# Patient Record
Sex: Male | Born: 1965 | Race: White | Hispanic: No | Marital: Married | Smoking: Current every day smoker
Health system: Southern US, Community
[De-identification: ages and names within clinical notes are randomized; demographics above are authoritative.]

## PROBLEM LIST (undated history)

## (undated) DIAGNOSIS — Z87898 Personal history of other specified conditions: Secondary | ICD-10-CM

## (undated) DIAGNOSIS — K219 Gastro-esophageal reflux disease without esophagitis: Secondary | ICD-10-CM

## (undated) DIAGNOSIS — I499 Cardiac arrhythmia, unspecified: Secondary | ICD-10-CM

## (undated) DIAGNOSIS — J189 Pneumonia, unspecified organism: Secondary | ICD-10-CM

## (undated) DIAGNOSIS — K648 Other hemorrhoids: Secondary | ICD-10-CM

## (undated) DIAGNOSIS — D126 Benign neoplasm of colon, unspecified: Secondary | ICD-10-CM

## (undated) DIAGNOSIS — T7840XA Allergy, unspecified, initial encounter: Secondary | ICD-10-CM

## (undated) DIAGNOSIS — R519 Headache, unspecified: Secondary | ICD-10-CM

## (undated) DIAGNOSIS — K589 Irritable bowel syndrome without diarrhea: Secondary | ICD-10-CM

## (undated) DIAGNOSIS — R0602 Shortness of breath: Secondary | ICD-10-CM

## (undated) DIAGNOSIS — I493 Ventricular premature depolarization: Secondary | ICD-10-CM

## (undated) DIAGNOSIS — I10 Essential (primary) hypertension: Secondary | ICD-10-CM

## (undated) DIAGNOSIS — N401 Enlarged prostate with lower urinary tract symptoms: Secondary | ICD-10-CM

## (undated) DIAGNOSIS — R51 Headache: Secondary | ICD-10-CM

## (undated) DIAGNOSIS — K5730 Diverticulosis of large intestine without perforation or abscess without bleeding: Secondary | ICD-10-CM

## (undated) DIAGNOSIS — J45909 Unspecified asthma, uncomplicated: Secondary | ICD-10-CM

## (undated) DIAGNOSIS — Z8719 Personal history of other diseases of the digestive system: Secondary | ICD-10-CM

## (undated) DIAGNOSIS — I472 Ventricular tachycardia: Secondary | ICD-10-CM

## (undated) HISTORY — DX: Gastro-esophageal reflux disease without esophagitis: K21.9

## (undated) HISTORY — DX: Personal history of other specified conditions: Z87.898

## (undated) HISTORY — DX: Ventricular premature depolarization: I49.3

## (undated) HISTORY — DX: Benign neoplasm of colon, unspecified: D12.6

## (undated) HISTORY — PX: COLONOSCOPY: SHX174

## (undated) HISTORY — PX: POLYPECTOMY: SHX149

## (undated) HISTORY — DX: Other hemorrhoids: K64.8

## (undated) HISTORY — PX: DIAGNOSTIC LAPAROSCOPY: SUR761

## (undated) HISTORY — DX: Ventricular tachycardia: I47.2

## (undated) HISTORY — PX: VASECTOMY: SHX75

## (undated) HISTORY — PX: COLON SURGERY: SHX602

## (undated) HISTORY — DX: Allergy, unspecified, initial encounter: T78.40XA

## (undated) HISTORY — DX: Irritable bowel syndrome, unspecified: K58.9

---

## 2009-04-02 ENCOUNTER — Encounter: Payer: Self-pay | Admitting: Family Medicine

## 2009-12-23 ENCOUNTER — Ambulatory Visit: Payer: Self-pay | Admitting: Family Medicine

## 2009-12-23 DIAGNOSIS — K219 Gastro-esophageal reflux disease without esophagitis: Secondary | ICD-10-CM

## 2009-12-23 DIAGNOSIS — R197 Diarrhea, unspecified: Secondary | ICD-10-CM | POA: Insufficient documentation

## 2009-12-23 HISTORY — DX: Gastro-esophageal reflux disease without esophagitis: K21.9

## 2009-12-25 ENCOUNTER — Encounter: Payer: Self-pay | Admitting: Family Medicine

## 2009-12-26 ENCOUNTER — Telehealth: Payer: Self-pay | Admitting: Family Medicine

## 2009-12-26 LAB — CONVERTED CEMR LAB
BUN: 18 mg/dL (ref 6–23)
Basophils Relative: 0.9 % (ref 0.0–3.0)
CO2: 30 meq/L (ref 19–32)
Chloride: 100 meq/L (ref 96–112)
Eosinophils Absolute: 0.2 10*3/uL (ref 0.0–0.7)
Eosinophils Relative: 1.7 % (ref 0.0–5.0)
HCT: 48.2 % (ref 39.0–52.0)
Lymphs Abs: 2.3 10*3/uL (ref 0.7–4.0)
MCHC: 34.4 g/dL (ref 30.0–36.0)
MCV: 93.2 fL (ref 78.0–100.0)
Monocytes Absolute: 1.2 10*3/uL — ABNORMAL HIGH (ref 0.1–1.0)
Potassium: 4.4 meq/L (ref 3.5–5.1)
RBC: 5.17 M/uL (ref 4.22–5.81)
WBC: 10.6 10*3/uL — ABNORMAL HIGH (ref 4.5–10.5)

## 2009-12-29 ENCOUNTER — Telehealth: Payer: Self-pay | Admitting: Family Medicine

## 2010-01-01 ENCOUNTER — Telehealth: Payer: Self-pay | Admitting: Family Medicine

## 2010-01-07 ENCOUNTER — Telehealth: Payer: Self-pay | Admitting: Family Medicine

## 2010-01-08 ENCOUNTER — Ambulatory Visit: Payer: Self-pay | Admitting: Family Medicine

## 2010-02-24 NOTE — Progress Notes (Signed)
Summary: C-Diff detected, pt informed  Phone Note Call from Patient   Caller: Triage Call Report Call For: Evelena Peat MD/Fry Summary of Call: Paper report given to me.  Mary/lab tech calling from [facility] regarding C-Diff, critical value C-diff detected.  On call physician called, spoke with Dr Debby Bud and notified him of lab result.  Per Dr Clent Ridges, call pt, see how he is feeling.  Pt reports he is feeling better, less frequent diarrhea, also stools less loose.  Pt informed Dr Clent Ridges ordered antibiotic and it will be at his pharmacy if he needs it.   Initial call taken by: Sid Falcon LPN,  December 26, 2009 2:34 PM  Follow-up for Phone Call        Would have low threshhold to start antibiotic (Flagyl) if he has any persistent stool change-even loose stools at this time. Follow-up by: Evelena Peat MD,  December 28, 2009 9:09 PM  Additional Follow-up for Phone Call Additional follow up Details #1::        pt has been notified of results and is symptomatically better. Additional Follow-up by: Evelena Peat MD,  December 30, 2009 12:56 PM

## 2010-02-24 NOTE — Progress Notes (Signed)
Summary: [facility] lab report  Phone Note From Other Clinic   Caller: [facility] Call For: [employer] of Call: Marylene Land from [facility] called with verbal repoft, stool culture positive for campylobacter Initial call taken by: Sid Falcon LPN,  December 29, 2009 11:54 AM  Follow-up for Phone Call        pt is symptomatically better without specific therapy. We have filled out paperwork to report to Health Dept.   Follow-up by: Evelena Peat MD,  December 29, 2009 5:55 PM

## 2010-02-24 NOTE — Progress Notes (Signed)
Summary: [facility] Dept of Public Health Disease report  Phone Note From Other Clinic   Caller: [facility] Dept Call For: Sunaina Ferrando Summary of Call: Depart of Public Health Dept [facility] requested providers notes and lab results related to Campylobacter infection  Confidential Communicable Disease Report filled out, faxed to [phone] along with medical notes, lab results and action taken. Confirmation received, confirmation received Initial call taken by: Sid Falcon LPN,  January 01, 2010 12:57 PM

## 2010-02-24 NOTE — Assessment & Plan Note (Signed)
Summary: GI CONCERNS / PT ADV TO ARRIVE AROUND 11:15AM -  OK PER DR BU...   Vital Signs:  Patient profile:   45 year old male Height:      71.50 inches Weight:      179 pounds BMI:     24.71 Temp:     98.5 degrees F oral Pulse rate:   72 / minute Pulse rhythm:   regular Resp:     12 per minute BP sitting:   130 / 88  (left arm) Cuff size:   regular  Vitals Entered By: Sid Falcon LPN (December 23, 2009 11:41 AM)   History of Present Illness: New patient to establish care.  Fairly unremarkable past medical history. Last year developed some dyspepsia and eventually had nasal laryngoscopy and was diagnosed with reflux type problem. Has been taking omeprazole 40 mg daily since July of this year. Those symptoms eventually improved. Denies any overt GERD symptoms at this time.  Surgical history significant only for vasectomy. No known drug allergies.  Family history signif. for grandparent with prostate cancer and stroke history  Patient is married has 3 children. Works as a Firefighter.. Smokes one pack cigs per day. Usually 2 drinks of vodka per day.  Acute issue of 8 day history of fairly severe watery, nonbloody diarrhea. Patient having about 10-12 stools per day. Not associated with any nausea, vomiting, or fever. He has associated achy cramping periumbilical pains. Pains come and go.  No clear exacerbating factors.  No recent antibiotics or travels. No family members with similar symptoms. Appetite okay. Possible mild weight loss of a few pounds past week. Took Pepto-Bismol without relief. Recalls some associated arthralgias first couple days of illness but none since then. No rashes. No family history of inflammatory bowel disease.  Preventive Screening-Counseling & Management  Alcohol-Tobacco     Smoking Status: current     Packs/Day: 1.0     Year Started: 1990  Caffeine-Diet-Exercise     Does Patient Exercise: yes  Allergies (verified): No Known Drug  Allergies  Past History:  Family History: Last updated: 12/23/2009 Family History of Prostate, paternal grandfather, uncles Paternal grandfather, stroke  Social History: Last updated: 12/23/2009 Occupation:  Firefighter Married Current Smoker Alcohol use-yes Regular exercise-yes  Risk Factors: Exercise: yes (12/23/2009)  Risk Factors: Smoking Status: current (12/23/2009) Packs/Day: 1.0 (12/23/2009)  Past Medical History: GERD  Past Surgical History: Vasectomy PMH-FH-SH reviewed for relevance  Family History: Family History of Prostate, paternal grandfather, uncles Paternal grandfather, stroke  Social History: Occupation:  Firefighter Married Current Smoker Alcohol use-yes Regular exercise-yes Smoking Status:  current Packs/Day:  1.0 Occupation:  employed Does Patient Exercise:  yes  Review of Systems       The patient complains of weight loss and abdominal pain.  The patient denies anorexia, fever, chest pain, syncope, dyspnea on exertion, melena, hematochezia, severe indigestion/heartburn, suspicious skin lesions, and enlarged lymph nodes.    Physical Exam  General:  Well-developed,well-nourished,in no acute distress; alert,appropriate and cooperative throughout examination Mouth:  Oral mucosa and oropharynx without lesions or exudates.  Teeth in good repair. Neck:  No deformities, masses, or tenderness noted. Lungs:  Normal respiratory effort, chest expands symmetrically. Lungs are clear to auscultation, no crackles or wheezes. Heart:  Normal rate and regular rhythm. S1 and S2 normal without gallop, murmur, click, rub or other extra sounds. Abdomen:  Bowel sounds positive,abdomen soft and non-tender without masses, organomegaly or hernias noted. Rectal:  No external abnormalities noted. Normal  sphincter tone. No rectal masses or tenderness. hemoccult neg. Extremities:  No clubbing, cyanosis, edema, or deformity noted with normal full range of  motion of all joints.   Skin:  no rashes and no suspicious lesions.     Impression & Recommendations:  Problem # 1:  DIARRHEA (ICD-787.91) Assessment New This does not sound typical of acute viral gastroenteritis with no assoc vomiting.  Diarrhea has been fairly severe.  No clear risk factors for specific cause.  ?acute colitis.  Get some initial labs and pt will try Imodium.  GI refer if persists. He does not appear dehydrated at this time. Hold omeprazole for now. Orders: T-C diff by PCR (25956) T-Stool Giardia / Crypto- EIA (38756) T-Culture, Stool ([id number]) Specimen Handling (99000) Venipuncture (43329) TLB-BMP (Basic Metabolic Panel-BMET) (80048-METABOL) TLB-CBC Platelet - w/Differential (85025-CBCD) TLB-TSH (Thyroid Stimulating Hormone) (84443-TSH) TLB-Sedimentation Rate (ESR) (85652-ESR)  Problem # 2:  GERD (ICD-530.81)  His updated medication list for this problem includes:    Omeprazole 40 Mg Cpdr (Omeprazole) ..... Once daily  Complete Medication List: 1)  Omeprazole 40 Mg Cpdr (Omeprazole) .... Once daily  Patient Instructions: 1)  Try OTC Imodium. 2)  Eat more potassium rich foods such as bananas, oranje juice, and salt substitutes .  3)  We will call you regarding labs results and may need to consider GI referral if symptoms persist.   Orders Added: 1)  T-C diff by PCR [81755] 2)  T-Stool Giardia / Crypto- EIA [51884] 3)  T-Culture, Stool [[id number]] 4)  Specimen Handling [99000] 5)  Venipuncture [16606] 6)  New Patient Level III [99203] 7)  TLB-BMP (Basic Metabolic Panel-BMET) [80048-METABOL] 8)  TLB-CBC Platelet - w/Differential [85025-CBCD] 9)  TLB-TSH (Thyroid Stimulating Hormone) [84443-TSH] 10)  TLB-Sedimentation Rate (ESR) [85652-ESR]

## 2010-02-26 NOTE — Progress Notes (Signed)
Summary: Pt called and is having same stomach issues and blood in stool  Phone Note Call from Patient Call back at [phone] cell   Caller: Patient Summary of Call: Pt called and said that he came in a few wks ago re: stomach issues and pt says that symptoms have returned. Now pt has blood in stool.Pt doesnt know what he should do? Pls advise.  Initial call taken by: Lucy Antigua,  January 07, 2010 4:24 PM  Follow-up for Phone Call        Scheduled OV for tomorrow Follow-up by: Sid Falcon LPN,  January 07, 2010 5:18 PM

## 2010-02-26 NOTE — Letter (Signed)
Summary: Communicable Disease Report  Communicable Disease Report   Imported By: Maryln Gottron 01/06/2010 10:03:55  _____________________________________________________________________  External Attachment:    Type:   Image     Comment:   External Document

## 2010-02-26 NOTE — Assessment & Plan Note (Signed)
Summary: abd pain returns, blood in stool/nn   Vital Signs:  Patient profile:   45 year old male Weight:      180 pounds Temp:     98.4 degrees F oral BP sitting:   132 / 88  (left arm) Cuff size:   regular  Vitals Entered By: Sid Falcon LPN (January 08, 2010 10:01 AM)  History of Present Illness: Here with recurrent diarrhea. Refer to recent notes.  Presented here with profuse diarrhea and pos cx for Campylobacter. By time cx back pt improving clinically and no antibiiotic given.  No definitive source. Also pos cx for C dificile.  He never filled Flagyl but sxs initially cleared until about 4 days ago with onset of diarrhea- mix of loose stools and watery and occ bloody.  No fever.  Some mild diffuse cramping.  No recent antiiotics or travels.   Allergies (verified): No Known Drug Allergies  Physical Exam  General:  Well-developed,well-nourished,in no acute distress; alert,appropriate and cooperative throughout examination Mouth:  Oral mucosa and oropharynx without lesions or exudates.  Teeth in good repair. Lungs:  Normal respiratory effort, chest expands symmetrically. Lungs are clear to auscultation, no crackles or wheezes. Heart:  Normal rate and regular rhythm. S1 and S2 normal without gallop, murmur, click, rub or other extra sounds. Abdomen:  Bowel sounds positive,abdomen soft and non-tender without masses, organomegaly or hernias noted.   Impression & Recommendations:  Problem # 1:  DIARRHEA (ICD-787.91) Prob at this point rel to C dificile which he never treated. Complete 14 d of Flagyl and touch base if no better by next week.  Complete Medication List: 1)  Omeprazole 40 Mg Cpdr (Omeprazole) .... Once daily 2)  Flagyl 500 Mg Tabs (Metronidazole) .... One tab three times a day x 14 days  Patient Instructions: 1)  Take your antibiotic as prescribed until ALL of it is gone, but stop if you develop a rash or swelling and contact our office as soon as possible.   2)  Drink plenty of fluids. 3)  Touch base in 1-2 weeks if no better and sooner if any fever, increased bloody stools, or vomiting. Prescriptions: FLAGYL 500 MG TABS (METRONIDAZOLE) one tab three times a day X 14 days  #42 x 0   Entered and Authorized by:   Evelena Peat MD   Signed by:   Evelena Peat MD on 01/08/2010   Method used:   [facility] to        [facility] [address]retail[facility]       [address]       [place], [place]  [address]       Ph: [phone] or [phone]       Fax: [phone]   RxID:   [id number]    Orders Added: 1)  Est. Patient Level III [95638]

## 2010-04-07 NOTE — Letter (Signed)
Summary: [facility]   Imported By: Maryln Gottron 03/30/2010 11:11:11  _____________________________________________________________________  External Attachment:    Type:   Image     Comment:   External Document

## 2010-10-09 ENCOUNTER — Encounter: Payer: Self-pay | Admitting: Family Medicine

## 2010-10-09 ENCOUNTER — Telehealth: Payer: Self-pay | Admitting: Gastroenterology

## 2010-10-09 ENCOUNTER — Ambulatory Visit (INDEPENDENT_AMBULATORY_CARE_PROVIDER_SITE_OTHER): Payer: BC Managed Care – PPO | Admitting: Family Medicine

## 2010-10-09 VITALS — BP 150/92 | Temp 98.5°F | Wt 191.0 lb

## 2010-10-09 DIAGNOSIS — K921 Melena: Secondary | ICD-10-CM

## 2010-10-09 DIAGNOSIS — R1032 Left lower quadrant pain: Secondary | ICD-10-CM

## 2010-10-09 LAB — POCT URINALYSIS DIPSTICK
Bilirubin, UA: NEGATIVE
Blood, UA: NEGATIVE
Glucose, UA: NEGATIVE
Ketones, UA: NEGATIVE
Spec Grav, UA: 1.02

## 2010-10-09 LAB — CBC WITH DIFFERENTIAL/PLATELET
Basophils Relative: 0.7 % (ref 0.0–3.0)
Eosinophils Absolute: 0.1 10*3/uL (ref 0.0–0.7)
HCT: 47.1 % (ref 39.0–52.0)
Hemoglobin: 15.8 g/dL (ref 13.0–17.0)
Lymphocytes Relative: 27.7 % (ref 12.0–46.0)
Lymphs Abs: 1.8 10*3/uL (ref 0.7–4.0)
MCHC: 33.6 g/dL (ref 30.0–36.0)
MCV: 95.1 fl (ref 78.0–100.0)
Monocytes Absolute: 0.8 10*3/uL (ref 0.1–1.0)
Neutro Abs: 3.9 10*3/uL (ref 1.4–7.7)
RBC: 4.95 Mil/uL (ref 4.22–5.81)

## 2010-10-09 NOTE — Patient Instructions (Signed)
We will call you regarding appointment with gastroenterologist.

## 2010-10-09 NOTE — Telephone Encounter (Signed)
Pt given an appt on 10/12/10 at 1:30pm. [facility] will contact the pt.

## 2010-10-09 NOTE — Progress Notes (Signed)
  Subjective:    Patient ID: [name] [name], male    DOB: 07-18-1965, 45 y.o.   MRN: [id number]  HPI Left lower abdominal pain for 6 weeks. Symptoms are daily.  He describes more of a diffuse ache lower abdomen but more intense pain left lower quadrant. Somewhat progressive. 3 weeks ago noticed some bright blood and slightly darker clotted blood with a few episodes since then. He denies any diarrhea or significant constipation but frequently has sensation of needing to go but unable to produce a stool. Occasionally feels bloated. Appetite is okay. Recent mild weight gain. No history of known diverticular disease.  Patient denies dysuria. No fever or chills. He had Campylobacter infection last year but had no recurrent symptoms since treatment. Father had colon polyps but no family history of colon cancer.  Past Medical History  Diagnosis Date  . GERD 12/23/2009  . Diarrhea 12/23/2009   Past Surgical History  Procedure Date  . Vasectomy     reports that he has been smoking Cigarettes.  He has a 20 pack-year smoking history. He does not have any smokeless tobacco history on file. His alcohol and drug histories not on file. family history includes Cancer in his paternal grandfather and Stroke in his paternal grandfather. No Known Allergies    Review of Systems  Constitutional: Negative for fever, chills, fatigue and unexpected weight change.  Respiratory: Negative for shortness of breath.   Cardiovascular: Negative for chest pain.  Gastrointestinal: Positive for abdominal pain and blood in stool. Negative for nausea, vomiting, diarrhea, constipation and rectal pain.  Genitourinary: Negative for dysuria and hematuria.  Neurological: Negative for dizziness.  Hematological: Negative for adenopathy.       Objective:   Physical Exam  Constitutional: He appears well-developed and well-nourished. No distress.  HENT:  Mouth/Throat: Oropharynx is clear and moist.  Neck: Neck supple.    Cardiovascular: Normal rate, regular rhythm and normal heart sounds.   Pulmonary/Chest: Effort normal and breath sounds normal. No respiratory distress. He has no wheezes. He has no rales.  Abdominal: Soft. Bowel sounds are normal. He exhibits no distension and no mass. There is tenderness. There is no rebound and no guarding.       Mildly tender left lower quadrant. No guarding or rebound  Genitourinary:       Rectal exam reveals no rectal mass. Minimal stool which is Hemoccult positive  No anal fissure and no external hemorrhoids  Lymphadenopathy:    He has no cervical adenopathy.          Assessment & Plan:  Six-week history of left lower quadrant abdominal pain, Hemoccult-positive stool, and lower abdominal bloating. Check urine dipstick and CBC. Gastroenterology referral for consideration of colonoscopy. We discussed other possible studies such as CT scan but given heme-positive stool have elected to get  gastroenterology opinion first.

## 2010-10-12 ENCOUNTER — Encounter: Payer: Self-pay | Admitting: Physician Assistant

## 2010-10-12 ENCOUNTER — Ambulatory Visit (INDEPENDENT_AMBULATORY_CARE_PROVIDER_SITE_OTHER): Payer: BC Managed Care – PPO | Admitting: Physician Assistant

## 2010-10-12 VITALS — BP 154/88 | HR 68 | Ht 72.0 in | Wt 194.4 lb

## 2010-10-12 DIAGNOSIS — R1032 Left lower quadrant pain: Secondary | ICD-10-CM

## 2010-10-12 DIAGNOSIS — K625 Hemorrhage of anus and rectum: Secondary | ICD-10-CM

## 2010-10-12 MED ORDER — PEG-KCL-NACL-NASULF-NA ASC-C 100 G PO SOLR: ORAL | Status: DC

## 2010-10-12 NOTE — Progress Notes (Signed)
Reviewed and agree.

## 2010-10-12 NOTE — Progress Notes (Signed)
Subjective:    Patient ID: [name] [name], male    DOB: [birth year], 45 y.o.   MRN: [id number]  HPI [name] [name] is a 45 year old white male, generally in good health, referred today as a new patient per Dr. Evelena Peat for evaluation of left lower quadrant pain and rectal bleeding. He reports onset of his current symptoms of 5-6 weeks ago with intermittent achiness "" in the mid abdomen which radiated across his abdomen. He also has occasional isolated "jab" of pain in the left lower quadrant which are occurring daily and may happen 2 or 3 times per day. Along with this he has had 3 separate episodes of bright red to dark blood in his bowel movements. Each episode has lasted 3-5 days and then resolves. His last episode of bleeding was last week. He has seen no blood on the tissue and blood mixed in with the bowel movements. Over the past 4-5 days he has been feeling fairly well and says he really has not had any pain. He has not had any fever or chills his bowel movements have been normal. He does feel that eating exacerbates his symptoms at times. His weight is actually up 10 pounds over the past couple months but he has had some schedule changes and has not been working out like he had been prior to that time.  He has not been on any new medications or supplements. Family history is positive for colon polyps in his father and stomach cancer in a paternal uncle.  Labs done on 10/09/2010 show WBC 6.6 hemoglobin 15.8 hematocrit 47.1 MCV of 95.1 platelets 260 UA was negative    Review of Systems  Constitutional: Negative.   HENT: Negative.   Eyes: Negative.   Respiratory: Negative.   Cardiovascular: Negative.   Gastrointestinal: Positive for abdominal pain and blood in stool.  Genitourinary: Negative.   Musculoskeletal: Negative.   Skin: Negative.   Neurological: Negative.   Hematological: Negative.   Psychiatric/Behavioral: Negative.    Outpatient Encounter Prescriptions as of 10/12/2010    Medication Sig Dispense Refill  . omeprazole (PRILOSEC) 40 MG capsule Take 40 mg by mouth daily.        . Testosterone (AXIRON) 30 MG/ACT SOLN Place onto the skin daily.        . peg 3350 powder (MOVIPREP) 100 G SOLR Moviprep Take as directed.  1 kit  0  . DISCONTD: metroNIDAZOLE (FLAGYL) 500 MG tablet Take 500 mg by mouth 3 (three) times daily. Take 1 tab 3 times daily x 14 days.        No Known Allergies    Past history, family history, and social history, are reviewed in the EPIC  chart Objective:   Physical Exam Well-developed white male in no acute distress, alert and oriented x3, blood pressure 154/88 pulse 68. HEENT nontraumatic normocephalic EOMI PERRLA sclera anicteric Neck; supple no JVD, Cardiovascular; regular rate and rhythm with S1-S2 no murmur rub or gallop, Pulmonary ;clear bilaterally. Abdomen; soft, bowel sounds active, no palpable mass or hepatosplenomegaly, minimally tender in the left lower quadrant no guarding or rebound Rectal not repeated this was done last week per Dr.Burchette and was Hemoccult-positive, Skin ; benign, tanned, Psych; mood and affect normal an appropriate        Assessment & Plan:  #38 45 year old male with 5-6 week history of lower abdominal discomfort and intermittent left lower quadrant pain, associated with episodes of blood mixed with his bowel movements. Etiology is not clear, will rule out  inflammatory bowel disease, diverticular disease and occult neoplasm.  Plan; Schedule for colonoscopy with Dr. Lina Sar, procedure was discussed in detail with the patient. Further plans pending results of colonoscopy

## 2010-10-12 NOTE — Patient Instructions (Signed)
We have scheduled the colonoscopy with Dr. Lina Sar. We have faxed the prescription for the colonoscopy prep you will be drinking to [facility] [place], [place].  Colonoscopy A colonoscopy is an exam to evaluate your entire colon. In this exam, your colon is cleansed. A long fiberoptic tube is inserted through your rectum and into your colon. The fiberoptic scope (endoscope) is a long bundle of enclosed and very flexible fibers. These fibers transmit light to the area examined and send images from that area to your caregiver. Discomfort is usually minimal. You may be given a drug to help you sleep (sedative) during or prior to the procedure. This exam helps to detect lumps (tumors), polyps, inflammation, and areas of bleeding. Your caregiver may also take a small piece of tissue (biopsy) that will be examined under a microscope. BEFORE THE PROCEDURE  A clear liquid diet may be required for 2 days before the exam.   Liquid injections (enemas) or laxatives may be required.   A large amount of electrolyte solution may be given to you to drink over a short period of time. This solution is used to clean out your colon.   Check in at the admissions desk to fill out necessary forms if not preregistered. There will be consent forms to sign prior to the procedure. If accompanied by friends or family, there is a waiting area for them while you are having your procedure.  LET YOUR CAREGIVER KNOW ABOUT:  Allergies to food or medicine.  Medicines taken, including vitamins, herbs, eyedrops, over-the-counter medicines, and creams.   Use of steroids (by mouth or creams).   Previous problems with anesthetics or numbing medicines.   History of bleeding problems or blood clots.  Previous surgery.   Other health problems, including diabetes and kidney problems.   Possibility of pregnancy, if this applies.   AFTER THE PROCEDURE  If you received a sedative and/or pain medicine, you will need to arrange for  someone to drive you home.   Occasionally, there is a little blood passed with the first bowel movement. DO NOT be concerned.  HOME CARE INSTRUCTIONS  It is not unusual to pass moderate amounts of gas and experience mild abdominal cramping following the procedure. This is due to air being used to inflate your colon during the exam. Walking or a warm pack on your belly (abdomen) may help.   You may resume all normal meals and activities after sedatives and medicines have worn off.   Only take over-the-counter or prescription medicines for pain, discomfort, or fever as directed by your caregiver. DO NOT use aspirin or blood thinners if a biopsy was taken. Consult your caregiver for medicine usage if biopsies were taken.  FINDING OUT THE RESULTS OF YOUR TEST Not all test results are available during your visit. If your test results are not back during the visit, make an appointment with your caregiver to find out the results. Do not assume everything is normal if you have not heard from your caregiver or the medical facility. It is important for you to follow up on all of your test results. SEEK IMMEDIATE MEDICAL CARE IF:  You pass large blood clots or fill a toilet with blood following the procedure. This may also occur 10 to 14 days following the procedure. This is more likely if a biopsy was taken.   You develop abdominal pain that keeps getting worse and cannot be relieved with medicine.  Document Released: 01/09/2000 Document Re-Released: 04/07/2009 Exit[employer] Patient Information  2011 [place], [place].

## 2010-10-14 NOTE — Progress Notes (Signed)
Quick Note:  Pt informed ______ 

## 2010-10-15 ENCOUNTER — Ambulatory Visit (AMBULATORY_SURGERY_CENTER): Payer: BC Managed Care – PPO | Admitting: Internal Medicine

## 2010-10-15 ENCOUNTER — Encounter: Payer: Self-pay | Admitting: Internal Medicine

## 2010-10-15 DIAGNOSIS — D1330 Benign neoplasm of unspecified part of small intestine: Secondary | ICD-10-CM

## 2010-10-15 DIAGNOSIS — R1032 Left lower quadrant pain: Secondary | ICD-10-CM

## 2010-10-15 DIAGNOSIS — Z83719 Family history of colon polyps, unspecified: Secondary | ICD-10-CM

## 2010-10-15 DIAGNOSIS — K625 Hemorrhage of anus and rectum: Secondary | ICD-10-CM

## 2010-10-15 DIAGNOSIS — Z8371 Family history of colonic polyps: Secondary | ICD-10-CM

## 2010-10-15 DIAGNOSIS — R197 Diarrhea, unspecified: Secondary | ICD-10-CM

## 2010-10-15 DIAGNOSIS — K529 Noninfective gastroenteritis and colitis, unspecified: Secondary | ICD-10-CM

## 2010-10-15 MED ORDER — HYDROCORTISONE ACETATE 25 MG RE SUPP: 25.0000 mg | Freq: Every evening | RECTAL | Status: AC | PRN

## 2010-10-15 MED ORDER — HYDROCORTISONE ACETATE 25 MG RE SUPP: 25.0000 mg | Freq: Every day | RECTAL | Status: AC

## 2010-10-15 MED ORDER — HYOSCYAMINE SULFATE 0.125 MG SL SUBL: 0.1250 mg | SUBLINGUAL_TABLET | SUBLINGUAL | Status: DC | PRN

## 2010-10-15 MED ORDER — SODIUM CHLORIDE 0.9 % IV SOLN: 500.0000 mL | INTRAVENOUS | Status: DC

## 2010-10-16 ENCOUNTER — Telehealth: Payer: Self-pay

## 2010-10-16 NOTE — Telephone Encounter (Signed)
No ID on answering machine. 

## 2010-10-21 ENCOUNTER — Encounter: Payer: Self-pay | Admitting: Internal Medicine

## 2010-10-30 ENCOUNTER — Telehealth: Payer: Self-pay | Admitting: Internal Medicine

## 2010-10-30 NOTE — Telephone Encounter (Signed)
Patient calling because he just received a letter with path results of his colonoscopy. 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 He was told at his procedure that he did not have polyps but abnormal mucosa at ileoceccal valve. He would like more explanation of exactly what he has. Please, advise.

## 2010-11-01 NOTE — Telephone Encounter (Signed)
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 The biopsies sometimes reveal abnormalities not diagnosed from direct view during colonoscopy. The abnormality has to be followed up with a repeat colonoscopy  As mentioned in the letter. If he wants to discuss it further, I will be happy to see him in the office.I  don't thing that his abd. pain was related to his polyp.

## 2010-11-02 NOTE — Telephone Encounter (Signed)
Left message for pt to call back  °

## 2010-11-04 NOTE — Telephone Encounter (Signed)
Spoke with patient and gave him Dr. Regino Schultze explanation and recommendation

## 2010-12-21 ENCOUNTER — Other Ambulatory Visit: Payer: Self-pay | Admitting: Ophthalmology

## 2010-12-21 DIAGNOSIS — H469 Unspecified optic neuritis: Secondary | ICD-10-CM

## 2010-12-22 ENCOUNTER — Ambulatory Visit
Admission: RE | Admit: 2010-12-22 | Discharge: 2010-12-22 | Disposition: A | Discharge status: Home / Self Care | Payer: BC Managed Care – PPO | Source: Ambulatory Visit | Attending: Ophthalmology | Admitting: Ophthalmology

## 2010-12-22 DIAGNOSIS — H469 Unspecified optic neuritis: Secondary | ICD-10-CM

## 2010-12-22 MED ORDER — GADOBENATE DIMEGLUMINE 529 MG/ML IV SOLN
18.0000 mL | Freq: Once | INTRAVENOUS | Status: AC | PRN
  Administered 2010-12-22: 18 mL via INTRAVENOUS

## 2012-10-25 DIAGNOSIS — J189 Pneumonia, unspecified organism: Secondary | ICD-10-CM

## 2012-10-25 HISTORY — DX: Pneumonia, unspecified organism: J18.9

## 2012-10-26 ENCOUNTER — Ambulatory Visit (INDEPENDENT_AMBULATORY_CARE_PROVIDER_SITE_OTHER): Payer: BC Managed Care – PPO | Admitting: Nurse Practitioner

## 2012-10-26 ENCOUNTER — Encounter: Payer: Self-pay | Admitting: Nurse Practitioner

## 2012-10-26 VITALS — BP 140/88 | HR 79 | Temp 97.9°F | Ht 72.0 in | Wt 197.5 lb

## 2012-10-26 DIAGNOSIS — R059 Cough, unspecified: Secondary | ICD-10-CM

## 2012-10-26 DIAGNOSIS — J019 Acute sinusitis, unspecified: Secondary | ICD-10-CM

## 2012-10-26 DIAGNOSIS — R05 Cough: Secondary | ICD-10-CM

## 2012-10-26 MED ORDER — AMOXICILLIN-POT CLAVULANATE 875-125 MG PO TABS: 1.0000 | ORAL_TABLET | Freq: Two times a day (BID) | ORAL | Status: DC

## 2012-10-26 NOTE — Progress Notes (Signed)
Subjective:    Patient ID: [name] [name], male    DOB: 03/21/1965, 47 y.o.   MRN: [id number]  Headache  This is a chronic (3 weeks) problem. The current episode started 1 to 4 weeks ago. The problem has been waxing and waning. The pain is located in the frontal region. The pain does not radiate. The pain quality is not similar to prior headaches. The quality of the pain is described as aching. The pain is moderate. Associated symptoms include coughing, drainage, ear pain (fullness), sinus pressure and a sore throat (says throat is always sore). Pertinent negatives include no abdominal pain, blurred vision, eye redness, eye watering, facial sweating, fever, muscle aches, nausea, neck pain, numbness, seizures, swollen glands, tingling, visual change or vomiting. Nothing aggravates the symptoms. He has tried NSAIDs (advil cold) for the symptoms. The treatment provided mild relief. There is no history of cancer. (Tobacco use)  Cough This is a chronic problem. The current episode started 1 to 4 weeks ago. The problem has been waxing and waning. The cough is productive of sputum. Associated symptoms include chest pain (sternal), ear congestion, ear pain (fullness), headaches, nasal congestion, postnasal drip, a sore throat (says throat is always sore), shortness of breath and wheezing (clears w/coughing). Pertinent negatives include no chills, eye redness or fever. There is no history of asthma. tobacco use      Review of Systems  Constitutional: Negative for fever, chills and activity change.  HENT: Positive for ear pain (fullness), sore throat (says throat is always sore), postnasal drip and sinus pressure. Negative for neck pain.   Eyes: Negative for blurred vision and redness.  Respiratory: Positive for cough, shortness of breath and wheezing (clears w/coughing). Negative for chest tightness.   Cardiovascular: Positive for chest pain (sternal).  Gastrointestinal: Negative for nausea, vomiting and  abdominal pain.  Neurological: Positive for headaches. Negative for tingling, seizures and numbness.  Hematological: Negative for adenopathy.       Objective:   Physical Exam  Vitals reviewed. Constitutional: He is oriented to person, place, and time. He appears well-developed and well-nourished. No distress.  HENT:  Head: Normocephalic and atraumatic.  Right Ear: External ear normal. No drainage. Tympanic membrane is not injected and not erythematous. A middle ear effusion is present.  Left Ear: External ear normal. No drainage. Tympanic membrane is not injected and not erythematous. A middle ear effusion is present.  Mouth/Throat: Posterior oropharyngeal erythema present. No oropharyngeal exudate.  Eyes: Conjunctivae are normal. Right eye exhibits no discharge. Left eye exhibits no discharge.  Neck: No thyromegaly present.  Cardiovascular: Normal rate, regular rhythm and normal heart sounds.   No murmur heard. Pulmonary/Chest: Effort normal and breath sounds normal. No respiratory distress. He has no wheezes. He has no rales.  Lymphadenopathy:    He has no cervical adenopathy.  Neurological: He is alert and oriented to person, place, and time.  Skin: Skin is warm and dry.  Psychiatric: He has a normal mood and affect. His behavior is normal. Thought content normal.          Assessment & Plan:  1. Acute sinus infection See instructions - amoxicillin-clavulanate (AUGMENTIN) 875-125 MG per tablet; Take 1 tablet by mouth 2 (two) times daily.  Dispense: 14 tablet; Refill: 0  2. Cough May be bronchial pneumonia. Pt instructed to cb if no improvement or conditions worsens 3 elevATED bp Stop using decongestant. F/u PCP. 4 erythematous posterior pharynx, pt c/o always has sore throat, treated for reflux w/symptom  improvement. Tobacco user. Rec f/u w/PCP. Pt due for physical.

## 2012-10-26 NOTE — Patient Instructions (Addendum)
Start sinus washes daily (Neilmed sinus rinse). Start antibiotic. Eat yogurt daily while on antibiotic to offset potential diarrhea from antibiotic use. Use delsym cough syrup at night if cough is waking you. Please call for re-evaluation if you begin to run fever, become short of breath, or chest pain changes.  Sinusitis Sinusitis is redness, soreness, and swelling (inflammation) of the paranasal sinuses. Paranasal sinuses are air pockets within the bones of your face (beneath the eyes, the middle of the forehead, or above the eyes). In healthy paranasal sinuses, mucus is able to drain out, and air is able to circulate through them by way of your nose. However, when your paranasal sinuses are inflamed, mucus and air can become trapped. This can allow bacteria and other germs to grow and cause infection. Sinusitis can develop quickly and last only a short time (acute) or continue over a long period (chronic). Sinusitis that lasts for more than 12 weeks is considered chronic.  CAUSES  Causes of sinusitis include:  Allergies.  Structural abnormalities, such as displacement of the cartilage that separates your nostrils (deviated septum), which can decrease the air flow through your nose and sinuses and affect sinus drainage.  Functional abnormalities, such as when the small hairs (cilia) that line your sinuses and help remove mucus do not work properly or are not present. SYMPTOMS  Symptoms of acute and chronic sinusitis are the same. The primary symptoms are pain and pressure around the affected sinuses. Other symptoms include:  Upper toothache.  Earache.  Headache.  Bad breath.  Decreased sense of smell and taste.  A cough, which worsens when you are lying flat.  Fatigue.  Fever.  Thick drainage from your nose, which often is green and may contain pus (purulent).  Swelling and warmth over the affected sinuses. DIAGNOSIS  Your caregiver will perform a physical exam. During the exam,  your caregiver may:  Look in your nose for signs of abnormal growths in your nostrils (nasal polyps).  Tap over the affected sinus to check for signs of infection.  View the inside of your sinuses (endoscopy) with a special imaging device with a light attached (endoscope), which is inserted into your sinuses. If your caregiver suspects that you have chronic sinusitis, one or more of the following tests may be recommended:  Allergy tests.  Nasal culture A sample of mucus is taken from your nose and sent to a lab and screened for bacteria.  Nasal cytology A sample of mucus is taken from your nose and examined by your caregiver to determine if your sinusitis is related to an allergy. TREATMENT  Most cases of acute sinusitis are related to a viral infection and will resolve on their own within 10 days. Sometimes medicines are prescribed to help relieve symptoms (pain medicine, decongestants, nasal steroid sprays, or saline sprays).  However, for sinusitis related to a bacterial infection, your caregiver will prescribe antibiotic medicines. These are medicines that will help kill the bacteria causing the infection.  Rarely, sinusitis is caused by a fungal infection. In theses cases, your caregiver will prescribe antifungal medicine. For some cases of chronic sinusitis, surgery is needed. Generally, these are cases in which sinusitis recurs more than 3 times per year, despite other treatments. HOME CARE INSTRUCTIONS   Drink plenty of water. Water helps thin the mucus so your sinuses can drain more easily.  Use a humidifier.  Inhale steam 3 to 4 times a day (for example, sit in the bathroom with the shower running).  Apply a warm, moist washcloth to your face 3 to 4 times a day, or as directed by your caregiver.  Use saline nasal sprays to help moisten and clean your sinuses.  Take over-the-counter or prescription medicines for pain, discomfort, or fever only as directed by your  caregiver. SEEK IMMEDIATE MEDICAL CARE IF:  You have increasing pain or severe headaches.  You have nausea, vomiting, or drowsiness.  You have swelling around your face.  You have vision problems.  You have a stiff neck.  You have difficulty breathing. MAKE SURE YOU:   Understand these instructions.  Will watch your condition.  Will get help right away if you are not doing well or get worse. Document Released: 01/11/2005 Document Revised: 04/05/2011 Document Reviewed: 01/26/2011 [facility] Patient Information 2014 [place], [place].

## 2012-11-22 ENCOUNTER — Encounter (HOSPITAL_COMMUNITY): Payer: Self-pay | Admitting: Emergency Medicine

## 2012-11-22 ENCOUNTER — Emergency Department (HOSPITAL_COMMUNITY): Payer: BC Managed Care – PPO

## 2012-11-22 ENCOUNTER — Observation Stay (HOSPITAL_COMMUNITY)
Admission: EM | Admit: 2012-11-22 | Discharge: 2012-11-23 | Disposition: A | Discharge status: Home / Self Care | Payer: BC Managed Care – PPO | Attending: Internal Medicine | Admitting: Internal Medicine

## 2012-11-22 DIAGNOSIS — Z720 Tobacco use: Secondary | ICD-10-CM

## 2012-11-22 DIAGNOSIS — K219 Gastro-esophageal reflux disease without esophagitis: Secondary | ICD-10-CM | POA: Diagnosis present

## 2012-11-22 DIAGNOSIS — R0789 Other chest pain: Principal | ICD-10-CM | POA: Insufficient documentation

## 2012-11-22 DIAGNOSIS — H9190 Unspecified hearing loss, unspecified ear: Secondary | ICD-10-CM | POA: Insufficient documentation

## 2012-11-22 DIAGNOSIS — Z79899 Other long term (current) drug therapy: Secondary | ICD-10-CM | POA: Insufficient documentation

## 2012-11-22 DIAGNOSIS — R0602 Shortness of breath: Secondary | ICD-10-CM | POA: Insufficient documentation

## 2012-11-22 DIAGNOSIS — R079 Chest pain, unspecified: Secondary | ICD-10-CM

## 2012-11-22 DIAGNOSIS — R002 Palpitations: Secondary | ICD-10-CM | POA: Insufficient documentation

## 2012-11-22 DIAGNOSIS — Z8701 Personal history of pneumonia (recurrent): Secondary | ICD-10-CM | POA: Insufficient documentation

## 2012-11-22 DIAGNOSIS — R51 Headache: Secondary | ICD-10-CM | POA: Insufficient documentation

## 2012-11-22 DIAGNOSIS — F17200 Nicotine dependence, unspecified, uncomplicated: Secondary | ICD-10-CM | POA: Insufficient documentation

## 2012-11-22 HISTORY — DX: Headache, unspecified: R51.9

## 2012-11-22 HISTORY — DX: Shortness of breath: R06.02

## 2012-11-22 HISTORY — DX: Pneumonia, unspecified organism: J18.9

## 2012-11-22 HISTORY — DX: Headache: R51

## 2012-11-22 LAB — TROPONIN I
Troponin I: <0.3 ng/mL (ref <0.30)
Troponin I: <0.3 ng/mL (ref <0.30)

## 2012-11-22 LAB — COMPREHENSIVE METABOLIC PANEL
ALT: 55 U/L — ABNORMAL HIGH (ref 0–53)
AST: 54 U/L — ABNORMAL HIGH (ref 0–37)
Alkaline Phosphatase: 18 U/L — ABNORMAL LOW (ref 39–117)
CO2: 27 mEq/L (ref 19–32)
Calcium: 9.9 mg/dL (ref 8.4–10.5)
Chloride: 100 mEq/L (ref 96–112)
Creatinine, Ser: 0.8 mg/dL (ref 0.50–1.35)
GFR calc Af Amer: >90 mL/min (ref >90)
GFR calc non Af Amer: >90 mL/min (ref >90)
Glucose, Bld: 87 mg/dL (ref 70–99)
Sodium: 138 mEq/L (ref 135–145)
Total Bilirubin: 0.6 mg/dL (ref 0.3–1.2)

## 2012-11-22 LAB — CBC
Hemoglobin: 15.8 g/dL (ref 13.0–17.0)
MCH: 32.6 pg (ref 26.0–34.0)
MCV: 91.3 fL (ref 78.0–100.0)
RBC: 4.84 MIL/uL (ref 4.22–5.81)
WBC: 10.4 10*3/uL (ref 4.0–10.5)

## 2012-11-22 LAB — POCT I-STAT TROPONIN I: Troponin i, poc: 0.01 ng/mL (ref 0.00–0.08)

## 2012-11-22 MED ORDER — ACETAMINOPHEN 325 MG PO TABS
650.0000 mg | ORAL_TABLET | Freq: Once | ORAL | Status: AC
  Administered 2012-11-22: 650 mg via ORAL
  Filled 2012-11-22: qty 2

## 2012-11-22 MED ORDER — GI COCKTAIL ~~LOC~~: 30.0000 mL | Freq: Four times a day (QID) | ORAL | Status: DC | PRN

## 2012-11-22 MED ORDER — ACETAMINOPHEN 325 MG PO TABS
650.0000 mg | ORAL_TABLET | ORAL | Status: DC | PRN
  Administered 2012-11-22 – 2012-11-23 (×2): 650 mg via ORAL
  Filled 2012-11-22: qty 2

## 2012-11-22 MED ORDER — ASPIRIN EC 325 MG PO TBEC
325.0000 mg | DELAYED_RELEASE_TABLET | Freq: Every day | ORAL | Status: DC
  Administered 2012-11-23: 325 mg via ORAL
  Filled 2012-11-22: qty 1

## 2012-11-22 MED ORDER — PANTOPRAZOLE SODIUM 40 MG PO TBEC
80.0000 mg | DELAYED_RELEASE_TABLET | Freq: Every day | ORAL | Status: DC
  Administered 2012-11-22 – 2012-11-23 (×2): 80 mg via ORAL
  Filled 2012-11-22: qty 2

## 2012-11-22 MED ORDER — ONDANSETRON HCL 4 MG/2ML IJ SOLN: 4.0000 mg | Freq: Four times a day (QID) | INTRAMUSCULAR | Status: DC | PRN

## 2012-11-22 NOTE — ED Notes (Addendum)
Pt. Was at work, sitting at the desk,  Felt a flutter in his chest, ( had these in the past)  Developed lt. Side chest pain , non-radiating, pt. Stood up became dizzy and collapsed to the floor.  Paramedics report that pt. Was hyperventilating upon arrival and having chest pain 10/10. Pt. Received 324mg  ASA and 1 nitro. Presently chest pain is 0.  Skin is warm and dry. Pt. Denies any n/v.  Pt. Is recovering from pneumonia.  Pt. Also reports that he has been having intermittent headaches , feels like his ears are clogged.

## 2012-11-22 NOTE — ED Provider Notes (Addendum)
CSN: [id number]     Arrival date & time 11/22/12  0940 History   First MD Initiated Contact with Patient 11/22/12 [phone]     Chief Complaint  Patient presents with  . Chest Pain   (Consider location/radiation/quality/duration/timing/severity/associated sxs/prior Treatment) Patient is a 47 y.o. male presenting with chest pain. The history is provided by the patient, the spouse and the EMS personnel.  Chest Pain Associated symptoms: headache, palpitations and shortness of breath   Associated symptoms: no abdominal pain, no back pain, no cough, no diaphoresis, no fatigue, no fever, no nausea and not vomiting    patient brought in by EMS. Patient with acute onset of chest pain at 9:00 in the morning left midsternal area. A 10 out of 10 pain lasted for less than 15 minutes. When away with EMS dividing it with aspirin and nitroglycerin. Has not recurred. Associated with shortness of breath no nausea no vomiting nonradiating. Also had symptoms of palpitation chest pain as stated was 10 out of 10 felt like a very sharp pain patient almost passed out no actual loss of consciousness. No history of any cardiac events in the past patient never had pain like this before. Pain has not been made worse or better by anything.  Patient's wife raises concerns about some symptoms that he had over the last week that included some facial pressure some hearing problems is intermittent headaches.  Past Medical History  Diagnosis Date  . GERD 12/23/2009  . Pneumonia    Past Surgical History  Procedure Laterality Date  . Vasectomy     Family History  Problem Relation Age of Onset  . Cancer Paternal Grandfather     prostate  . Stroke Paternal Grandfather   . Stomach cancer Paternal Uncle   . Diabetes Maternal Grandmother   . Colon polyps Father    History  Substance Use Topics  . Smoking status: Current Every Day Smoker -- 1.00 packs/day for 20 years    Types: Cigarettes  . Smokeless tobacco: Never Used   . Alcohol Use: Yes     Comment: 2 drinks per night    Review of Systems  Constitutional: Negative for fever, diaphoresis and fatigue.  HENT: Positive for hearing loss and sinus pressure.   Eyes: Negative for visual disturbance.  Respiratory: Positive for chest tightness and shortness of breath. Negative for cough.   Cardiovascular: Positive for chest pain and palpitations. Negative for leg swelling.  Gastrointestinal: Negative for nausea, vomiting and abdominal pain.  Genitourinary: Negative for dysuria.  Musculoskeletal: Negative for back pain.  Skin: Negative for rash.  Neurological: Positive for headaches. Negative for facial asymmetry.  Hematological: Does not bruise/bleed easily.  Psychiatric/Behavioral: Negative for confusion.    Allergies  Review of patient's allergies indicates no known allergies.  Home Medications   Current Outpatient Rx  Name  Route  Sig  Dispense  Refill  . CIALIS 20 MG tablet               . omeprazole (PRILOSEC) 40 MG capsule   Oral   Take 40 mg by mouth daily.           . Testosterone (ANDROGEL PUMP) 20.25 MG/ACT (1.62%) GEL   Transdermal   Place 1 each onto the skin daily. On each shoulder          BP 140/81  Pulse 68  Temp(Src) 98 F (36.7 C) (Oral)  Resp 18  SpO2 96% Physical Exam  Nursing note and vitals reviewed. Constitutional: He  is oriented to person, place, and time. He appears well-developed and well-nourished. No distress.  HENT:  Head: Normocephalic and atraumatic.  Mouth/Throat: Oropharynx is clear and moist.  Eyes: Conjunctivae and EOM are normal. Pupils are equal, round, and reactive to light.  Neck: Normal range of motion. Neck supple.  Cardiovascular: Normal rate, regular rhythm, normal heart sounds and intact distal pulses.   No murmur heard. Pulmonary/Chest: Effort normal and breath sounds normal. No respiratory distress.  Abdominal: Soft. Bowel sounds are normal. There is no tenderness.   Musculoskeletal: Normal range of motion. He exhibits no edema.  Neurological: He is alert and oriented to person, place, and time. No cranial nerve deficit. He exhibits normal muscle tone. Coordination normal.  Skin: Skin is warm. No erythema.    ED Course  Procedures (including critical care time) Labs Review Labs Reviewed  COMPREHENSIVE METABOLIC PANEL - Abnormal; Notable for the following:    AST 54 (*)    ALT 55 (*)    Alkaline Phosphatase 18 (*)    All other components within normal limits  CBC  D-DIMER, QUANTITATIVE  POCT I-STAT TROPONIN I   Results for orders placed during the hospital encounter of 11/22/12  CBC      Result Value Range   WBC 10.4  4.0 - 10.5 K/uL   RBC 4.84  4.22 - 5.81 MIL/uL   Hemoglobin 15.8  13.0 - 17.0 g/dL   HCT 16.1  09.6 - 04.5 %   MCV 91.3  78.0 - 100.0 fL   MCH 32.6  26.0 - 34.0 pg   MCHC 35.7  30.0 - 36.0 g/dL   RDW 40.9  81.1 - 91.4 %   Platelets 277  150 - 400 K/uL  COMPREHENSIVE METABOLIC PANEL      Result Value Range   Sodium 138  135 - 145 mEq/L   Potassium 4.5  3.5 - 5.1 mEq/L   Chloride 100  96 - 112 mEq/L   CO2 27  19 - 32 mEq/L   Glucose, Bld 87  70 - 99 mg/dL   BUN 19  6 - 23 mg/dL   Creatinine, Ser 7.82  0.50 - 1.35 mg/dL   Calcium 9.9  8.4 - 95.6 mg/dL   Total Protein 8.1  6.0 - 8.3 g/dL   Albumin 4.6  3.5 - 5.2 g/dL   AST 54 (*) 0 - 37 U/L   ALT 55 (*) 0 - 53 U/L   Alkaline Phosphatase 18 (*) 39 - 117 U/L   Total Bilirubin 0.6  0.3 - 1.2 mg/dL   GFR calc non Af Amer >90  >90 mL/min   GFR calc Af Amer >90  >90 mL/min  D-DIMER, QUANTITATIVE      Result Value Range   D-Dimer, Quant 0.35  0.00 - 0.48 ug/mL-FEU  POCT I-STAT TROPONIN I      Result Value Range   Troponin i, poc 0.01  0.00 - 0.08 ng/mL   Comment 3             Imaging Review Dg Chest 2 View  11/22/2012   CLINICAL DATA:  Chest pain.  EXAM: CHEST  2 VIEW  COMPARISON:  None.  FINDINGS: The cardiac silhouette, mediastinal and hilar contours are normal.  The lungs are clear. No pneumothorax or pleural effusion. The bony thorax is intact.  IMPRESSION: No acute cardiopulmonary findings.   Electronically Signed   By: Loralie Champagne M.D.   On: 11/22/2012 11:14   Ct Head Wo  Contrast  11/22/2012   CLINICAL DATA:  History that the patient after having chest pain stood up and became dizzy and collapsed to the floor. Also history of intermittent headaches.  EXAM: CT HEAD WITHOUT CONTRAST  TECHNIQUE: Contiguous axial images were obtained from the base of the skull through the vertex without intravenous contrast.  COMPARISON:  MRI brain 12/22/2010.  FINDINGS: There is no evidence of brain mass, brain hemorrhage, or acute infarction.  The ventricular system is normal size and shape. There is no evidence of shift of midline structures, parenchymal lesion, or subdural or epidural hematoma.  The calvarium is intact. Mastoids are well aerated. No sinusitis is evident. There is slight nasal septal deviation to the right.  IMPRESSION: There is no evidence of brain mass, brain hemorrhage, or acute infarction.  No acute or active process is seen. No skull lesion is evident. No sinusitis is evident.   Electronically Signed   By: Onalee Hua  Call M.D.   On: 11/22/2012 15:17    EKG Interpretation     Ventricular Rate:  79 PR Interval:  149 QRS Duration: 82 QT Interval:  353 QTC Calculation: 405 R Axis:   65 Text Interpretation:  Sinus rhythm No previous ECGs available            MDM   1. Chest pain   2. Palpitations    Patient with a good story for cardiac chest pain. That was resolved in under 15 minutes by EMS with aspirin and nitroglycerin. Chest pain has remained away. Patient's workup otherwise is negative for any evidence of pulmonary embolism with a d-dimer that was negative EKG had no acute changes. First troponin was negative. Chest x-ray showed no evidence of pneumonia pneumothorax or pulmonary edema. In addition the patient's wife had raised concerns  for some intermittent headaches some hearing problems and some facial pressure over the last week or so so head CT was done which is negative. No evidence of sinusitis. If the symptoms persist MRI may be needed.  Discussed with hospitalist admitting team patient will be moved to telemetry as an observation patient. Patient remained chest pain-free.    Shelda Jakes, MD 11/22/12 1610  Shelda Jakes, MD 11/22/12 [phone]

## 2012-11-22 NOTE — H&P (Signed)
[facility] History and Physical  [name] [name] RUE:[id number] DOB: 04/10/1965 DOA: 11/22/2012  Referring physician: er PCP: Kristian Covey, MD  Specialists:   Chief Complaint: chest pain  HPI: [name] [name] is a 47 y.o. male  Who was at work today when he developed 10/10 chest pain that lasted about 10 minutes or until EMS gave him nitro.  He is now chest pain free.  No SBO, no fever, no chills.  + palpitations EMS reports hyperventilation when they arrived. Pain was described as sharp- did not lose consciousness.   Recent PNA treated with PCN by PCP.   Patient also complains of a month long headache and "fullness" in his ears  EKG and labs were unremarkable in the ER, hospitalist were asked to obs for chest pain  Review of Systems:all systems reviewed, negative unless stated above   Past Medical History  Diagnosis Date  . GERD 12/23/2009  . Pneumonia    Past Surgical History  Procedure Laterality Date  . Vasectomy     Social History:  reports that he has been smoking Cigarettes.  He has a 20 pack-year smoking history. He has never used smokeless tobacco. He reports that he drinks alcohol. He reports that he does not use illicit drugs.  No Known Allergies  Family History  Problem Relation Age of Onset  . Cancer Paternal Grandfather     prostate  . Stroke Paternal Grandfather   . Stomach cancer Paternal Uncle   . Diabetes Maternal Grandmother   . Colon polyps Father     Prior to Admission medications   Medication Sig Start Date End Date Taking? Authorizing Provider  CIALIS 20 MG tablet  09/10/10  Yes Historical Provider, MD  omeprazole (PRILOSEC) 40 MG capsule Take 40 mg by mouth daily.     Yes Historical Provider, MD  Testosterone (ANDROGEL PUMP) 20.25 MG/ACT (1.62%) GEL Place 1 each onto the skin daily. On each shoulder   Yes Historical Provider, MD   Physical Exam: Filed Vitals:   11/22/12 1546  BP: 140/81  Pulse: 68  Temp:   Resp: 18      General:  A+Ox3, NAD  Eyes: wnl  ENT: wnl  Neck: supple  Cardiovascular: rrr  Respiratory: clear anterior, no wheezing  Abdomen: +BS, soft, NT  Skin: no rashes or lesions  Musculoskeletal: moves all 4 ext  Psychiatric: normal mood/affect  Neurologic: CN 2-12 intact  Labs on Admission:  Basic Metabolic Panel:  Recent Labs Lab 11/22/12 1104  NA 138  K 4.5  CL 100  CO2 27  GLUCOSE 87  BUN 19  CREATININE 0.80  CALCIUM 9.9   Liver Function Tests:  Recent Labs Lab 11/22/12 1104  AST 54*  ALT 55*  ALKPHOS 18*  BILITOT 0.6  PROT 8.1  ALBUMIN 4.6   No results found for this basename: LIPASE, AMYLASE,  in the last 168 hours No results found for this basename: AMMONIA,  in the last 168 hours CBC:  Recent Labs Lab 11/22/12 1104  WBC 10.4  HGB 15.8  HCT 44.2  MCV 91.3  PLT 277   Cardiac Enzymes: No results found for this basename: CKTOTAL, CKMB, CKMBINDEX, TROPONINI,  in the last 168 hours  BNP (last 3 results) No results found for this basename: PROBNP,  in the last 8760 hours CBG: No results found for this basename: GLUCAP,  in the last 168 hours  Radiological Exams on Admission: Dg Chest 2 View  11/22/2012   CLINICAL DATA:  Chest pain.  EXAM: CHEST  2 VIEW  COMPARISON:  None.  FINDINGS: The cardiac silhouette, mediastinal and hilar contours are normal. The lungs are clear. No pneumothorax or pleural effusion. The bony thorax is intact.  IMPRESSION: No acute cardiopulmonary findings.   Electronically Signed   By: Loralie Champagne M.D.   On: 11/22/2012 11:14   Ct Head Wo Contrast  11/22/2012   CLINICAL DATA:  History that the patient after having chest pain stood up and became dizzy and collapsed to the floor. Also history of intermittent headaches.  EXAM: CT HEAD WITHOUT CONTRAST  TECHNIQUE: Contiguous axial images were obtained from the base of the skull through the vertex without intravenous contrast.  COMPARISON:  MRI brain 12/22/2010.   FINDINGS: There is no evidence of brain mass, brain hemorrhage, or acute infarction.  The ventricular system is normal size and shape. There is no evidence of shift of midline structures, parenchymal lesion, or subdural or epidural hematoma.  The calvarium is intact. Mastoids are well aerated. No sinusitis is evident. There is slight nasal septal deviation to the right.  IMPRESSION: There is no evidence of brain mass, brain hemorrhage, or acute infarction.  No acute or active process is seen. No skull lesion is evident. No sinusitis is evident.   Electronically Signed   By: Onalee Hua  Call M.D.   On: 11/22/2012 15:17    EKG: Independently reviewed. NSR  Assessment/Plan Active Problems:   GERD   Chest pain   Tobacco abuse   1. Chest pain- risk factors include age and tobacco abuse, cycle CE, place on tele, ? Esophageal spasm as relief with nitro and h/o GERD 2. GERD- continue PPI 3. Tobacco abuse- encourage cessation 4. Mild increase in LFTs   Code Status: full Family Communication: patient Disposition Plan: obs  Time spent: 75 min  Saloni Lablanc [facility] Pager [phone]  If 7PM-7AM, please contact night-coverage [email or web address] Password [email or web address] 11/22/2012, 4:07 PM

## 2012-11-23 DIAGNOSIS — I519 Heart disease, unspecified: Secondary | ICD-10-CM

## 2012-11-23 LAB — LIPID PANEL
Cholesterol: 185 mg/dL (ref 0–200)
Triglycerides: 107 mg/dL (ref <150)

## 2012-11-23 LAB — TROPONIN I: Troponin I: <0.3 ng/mL (ref <0.30)

## 2012-11-23 MED ORDER — ASPIRIN 325 MG PO TBEC: 325.0000 mg | DELAYED_RELEASE_TABLET | Freq: Every day | ORAL | Status: DC

## 2012-11-23 NOTE — Progress Notes (Signed)
Echocardiogram 2D Echocardiogram has been performed.  [name] [name] 11/23/2012, 2:53 PM

## 2012-11-23 NOTE — Discharge Summary (Signed)
Physician Discharge Summary  [name] [name]:[id number] DOB: 1966/01/15 DOA: 11/22/2012  PCP: Kristian Covey, MD  Admit date: 11/22/2012 Discharge date: 11/23/2012  Time spent: 35 minutes  Recommendations for Outpatient Follow-up:  1. Follow up with cardiology for further evaluation of dyspnea, consideration for Myoview.  2. Need repeat LFT.   Discharge Diagnoses:     GERD   Chest pain   Tobacco abuse   Discharge Condition: Stable.   Diet recommendation: Heart Healthy  Filed Weights   11/22/12 1635  Weight: 88.451 kg (195 lb)    History of present illness:  [name] [name] is a 47 y.o. male  Who was at work today when he developed 10/10 chest pain that lasted about 10 minutes or until EMS gave him nitro. He is now chest pain free. No SBO, no fever, no chills. + palpitations  EMS reports hyperventilation when they arrived.  Pain was described as sharp- did not lose consciousness.  Recent PNA treated with PCN by PCP.  Patient also complains of a month long headache and "fullness" in his ears  EKG and labs were unremarkable in the ER, hospitalist were asked to obs for chest pain   Hospital Course:  1-Chest Pain: Atypical, EKG unremarkable. Troponin times 3 negative. Chest pain has resolved. Etiology muscle skeletal vs GI in origin. ECHO show normal EF, no wall motion abnormalities. Diastolic dysfunction grade 2. Patient does relates dyspnea on exertion for months. I have arrange follow up with cardiology, [place]. Normal LDL. Normal D dimer.  2-Mild Transaminases: need repeat level outpatient.   Procedures: ECHO: No prior study for comparison. Upper normal LV wall thickness with LVEF 55-60%, grade 2 diastolic dysfunction. Trivial tricuspid regurgitation with PASP 26 mm mercury. No pericardial effusion.    Consultations:  none  Discharge Exam: Filed Vitals:   11/23/12 1702  BP: 134/74  Pulse: 62  Temp: 98.5 F (36.9 C)  Resp: 17    General: no distress.   Cardiovascular: S 1, S 2 RRR Respiratory: CTA  Discharge Instructions  Discharge Orders   Future Orders Complete By Expires   Diet - low sodium heart healthy  As directed    Increase activity slowly  As directed        Medication List    STOP taking these medications       ANDROGEL PUMP 20.25 MG/ACT (1.62%) Gel  Generic drug:  Testosterone     CIALIS 20 MG tablet  Generic drug:  tadalafil      TAKE these medications       aspirin 325 MG EC tablet  Take 1 tablet (325 mg total) by mouth daily.     omeprazole 40 MG capsule  Commonly known as:  PRILOSEC  Take 40 mg by mouth daily.       No Known Allergies     Follow-up Information   Please follow up. [facility] [phone] will call you with an appointment. )        The results of significant diagnostics from this hospitalization (including imaging, microbiology, ancillary and laboratory) are listed below for reference.    Significant Diagnostic Studies: Dg Chest 2 View  11/22/2012   CLINICAL DATA:  Chest pain.  EXAM: CHEST  2 VIEW  COMPARISON:  None.  FINDINGS: The cardiac silhouette, mediastinal and hilar contours are normal. The lungs are clear. No pneumothorax or pleural effusion. The bony thorax is intact.  IMPRESSION: No acute cardiopulmonary findings.   Electronically Signed   By: Loraine Leriche  Gallerani M.D.   On: 11/22/2012 11:14   Ct Head Wo Contrast  11/22/2012   CLINICAL DATA:  History that the patient after having chest pain stood up and became dizzy and collapsed to the floor. Also history of intermittent headaches.  EXAM: CT HEAD WITHOUT CONTRAST  TECHNIQUE: Contiguous axial images were obtained from the base of the skull through the vertex without intravenous contrast.  COMPARISON:  MRI brain 12/22/2010.  FINDINGS: There is no evidence of brain mass, brain hemorrhage, or acute infarction.  The ventricular system is normal size and shape. There is no evidence of shift of midline structures, parenchymal  lesion, or subdural or epidural hematoma.  The calvarium is intact. Mastoids are well aerated. No sinusitis is evident. There is slight nasal septal deviation to the right.  IMPRESSION: There is no evidence of brain mass, brain hemorrhage, or acute infarction.  No acute or active process is seen. No skull lesion is evident. No sinusitis is evident.   Electronically Signed   By: Onalee Hua  Call M.D.   On: 11/22/2012 15:17    Microbiology: No results found for this or any previous visit (from the past 240 hour(s)).   Labs: Basic Metabolic Panel:  Recent Labs Lab 11/22/12 1104  NA 138  K 4.5  CL 100  CO2 27  GLUCOSE 87  BUN 19  CREATININE 0.80  CALCIUM 9.9   Liver Function Tests:  Recent Labs Lab 11/22/12 1104  AST 54*  ALT 55*  ALKPHOS 18*  BILITOT 0.6  PROT 8.1  ALBUMIN 4.6   No results found for this basename: LIPASE, AMYLASE,  in the last 168 hours No results found for this basename: AMMONIA,  in the last 168 hours CBC:  Recent Labs Lab 11/22/12 1104  WBC 10.4  HGB 15.8  HCT 44.2  MCV 91.3  PLT 277   Cardiac Enzymes:  Recent Labs Lab 11/22/12 1710 11/22/12 2205 11/23/12 0405  TROPONINI <0.30 <0.30 <0.30   BNP: BNP (last 3 results) No results found for this basename: PROBNP,  in the last 8760 hours CBG: No results found for this basename: GLUCAP,  in the last 168 hours     Signed:  REGALADO,BELKYS  [facility] 11/23/2012, 5:06 PM

## 2012-11-30 ENCOUNTER — Other Ambulatory Visit: Payer: Self-pay

## 2012-12-08 ENCOUNTER — Encounter: Payer: Self-pay | Admitting: Internal Medicine

## 2012-12-08 ENCOUNTER — Ambulatory Visit (INDEPENDENT_AMBULATORY_CARE_PROVIDER_SITE_OTHER): Payer: BC Managed Care – PPO | Admitting: Internal Medicine

## 2012-12-08 VITALS — BP 138/90 | HR 63 | Ht 72.0 in | Wt 201.4 lb

## 2012-12-08 DIAGNOSIS — R079 Chest pain, unspecified: Secondary | ICD-10-CM

## 2012-12-08 MED ORDER — NITROGLYCERIN 0.4 MG SL SUBL: 0.4000 mg | SUBLINGUAL_TABLET | SUBLINGUAL | Status: DC | PRN

## 2012-12-08 MED ORDER — OMEPRAZOLE 40 MG PO CPDR: 40.0000 mg | DELAYED_RELEASE_CAPSULE | Freq: Two times a day (BID) | ORAL | Status: DC

## 2012-12-08 NOTE — Progress Notes (Signed)
HPI Patient is a 47 yo with no known CAD  He presents for evaluation of CP On Oct 29 he felt his heart racingin throat.  Experienced CP  10/10   EMS called  NTG given  Helped some  2nd and gone. Taken to [facility].  Labs, EKG neg. No comment of arrhythmia. Sent home Since then had one other spell that was not as severe.  Again felt heart racing in throat.  He used to exercise a lot  Has not over the past 18 months because of work  Does say he gets more fatigued.    Does have a history of reflux.  Does note that sometimes it feels like food gets stuck No Known Allergies  Current Outpatient Prescriptions  Medication Sig Dispense Refill  . aspirin EC 325 MG EC tablet Take 1 tablet (325 mg total) by mouth daily.  30 tablet  0  . omeprazole (PRILOSEC) 40 MG capsule Take 1 capsule (40 mg total) by mouth 2 (two) times daily.  60 capsule  12  . nitroGLYCERIN (NITROSTAT) 0.4 MG SL tablet Place 1 tablet (0.4 mg total) under the tongue every 5 (five) minutes as needed for chest pain.  90 tablet  3   No current facility-administered medications for this visit.    Past Medical History  Diagnosis Date  . GERD 12/23/2009  . Pneumonia 10/2012  . Shortness of breath     "at rest" (11/22/2012)  . Daily headache     Past Surgical History  Procedure Laterality Date  . Vasectomy  1990's    Family History  Problem Relation Age of Onset  . Cancer Paternal Grandfather     prostate  . Stroke Paternal Grandfather   . Stomach cancer Paternal Uncle   . Diabetes Maternal Grandmother   . Colon polyps Father     History   Social History  . Marital Status: Married    Spouse Name: N/A    Number of Children: 3  . Years of Education: N/A   Occupational History  . Firefighter   . OWNER    Social History Main Topics  . Smoking status: Current Every Day Smoker -- 1.00 packs/day for 23 years    Types: Cigarettes  . Smokeless tobacco: Never Used  . Alcohol Use: 16.8 oz/week    28  Shots of liquor per week     Comment: 11/21/2012 "couple shots in each drink; 2 mixed drinks/night"  . Drug Use: No  . Sexual Activity: Yes   Other Topics Concern  . Not on file   Social History Narrative   Patient does regular exercise.          Review of Systems:  All systems reviewed.  They are negative to the above problem except as previously stated.  Vital Signs: BP 138/90  Pulse 63  Ht 6' (1.829 m)  Wt 201 lb 6.4 oz (91.354 kg)  BMI 27.31 kg/m2  Physical Exam Patient is in NAD HEENT:  Normocephalic, atraumatic. EOMI, PERRLA.  Neck: JVP is normal.  No bruits.  Lungs: clear to auscultation. No rales no wheezes.  Heart: Regular rate and rhythm. Normal S1, S2. No S3.   No significant murmurs. PMI not displaced.  Abdomen:  Supple, nontender. Normal bowel sounds. No masses. No hepatomegaly.  Extremities:   Good distal pulses throughout. No lower extremity edema.  Musculoskeletal :moving all extremities.  Neuro:   alert and oriented x3.  CN II-XII grossly intact.  EKG SR  63 bpm  Nonspecific ST T wave changes   Assessment and Plan:  1.  CP  Atypical  Part sounds like precipitated by arrhythmia.  But nothing documented Discussed with patient  He would like to follow  Would increaesd omprazole to bid Give NTG as needed  If recurs consider event monitor and/or stress test. Keep on ec ASA 2.  Tobacco  Patient trying to quit  Encouraged him to continue.    3  HCM  Lipids good.

## 2012-12-08 NOTE — Patient Instructions (Signed)
Your physician recommends that you schedule a follow-up appointment in: TBD  INCREASE OMEPRAZOLE TO  TWICE DAILY

## 2013-02-26 ENCOUNTER — Encounter: Payer: Self-pay | Admitting: Family Medicine

## 2013-02-26 ENCOUNTER — Ambulatory Visit (INDEPENDENT_AMBULATORY_CARE_PROVIDER_SITE_OTHER): Payer: BC Managed Care – PPO | Admitting: Family Medicine

## 2013-02-26 VITALS — BP 140/78 | HR 101 | Temp 98.2°F | Wt 208.0 lb

## 2013-02-26 DIAGNOSIS — J029 Acute pharyngitis, unspecified: Secondary | ICD-10-CM

## 2013-02-26 LAB — POCT RAPID STREP A (OFFICE): Rapid Strep A Screen: NEGATIVE

## 2013-02-26 NOTE — Progress Notes (Signed)
Pre visit review using our clinic review tool, if applicable. No additional management support is needed unless otherwise documented below in the visit note. 

## 2013-02-26 NOTE — Patient Instructions (Signed)

## 2013-02-26 NOTE — Progress Notes (Signed)
   Subjective:    Patient ID: [name] [name], male    DOB: 1966/01/16, 48 y.o.   MRN: [id number]  HPI Acute visit Onset Saturday of cough, body aches, chills, malaise, nasal congestion, sore throat. He also developed some laryngitis. Wife has somewhat similar symptoms. He is generally very healthy. No chronic medical problems. Nonsmoker. Taking over-the-counter medications for symptomatic relief. Denies any nausea or vomiting. No skin rashes.  Past Medical History  Diagnosis Date  . GERD 12/23/2009  . Pneumonia 10/2012  . Shortness of breath     "at rest" (11/22/2012)  . Daily headache    Past Surgical History  Procedure Laterality Date  . Vasectomy  1990's    reports that he has been smoking Cigarettes.  He has a 23 pack-year smoking history. He has never used smokeless tobacco. He reports that he drinks about 16.8 ounces of alcohol per week. He reports that he does not use illicit drugs. family history includes Cancer in his paternal grandfather; Colon polyps in his father; Diabetes in his maternal grandmother; Stomach cancer in his paternal uncle; Stroke in his paternal grandfather. No Known Allergies    Review of Systems  Constitutional: Positive for chills and fatigue. Negative for fever.  HENT: Positive for congestion and sore throat.   Respiratory: Positive for choking.   Cardiovascular: Negative for chest pain.       Objective:   Physical Exam  Constitutional: He appears well-developed and well-nourished.  HENT:  Right Ear: External ear normal.  Left Ear: External ear normal.  Oropharynx reveals diffuse posterior erythema. No exudate.  Neck: Neck supple.  Cardiovascular: Normal rate.   Pulmonary/Chest: Effort normal and breath sounds normal. No respiratory distress. He has no wheezes. He has no rales.  Lymphadenopathy:    He has cervical adenopathy.          Assessment & Plan:  Acute upper respiratory infection. Suspect viral. Rule out group A strep. Rapid  strep obtained.  If negative, treat symptomatically. Rapid strep negative

## 2013-02-28 ENCOUNTER — Telehealth: Payer: Self-pay | Admitting: Family Medicine

## 2013-02-28 NOTE — Telephone Encounter (Signed)
Relevant patient education assigned to patient using Emmi. ° °

## 2013-04-26 ENCOUNTER — Telehealth: Payer: Self-pay | Admitting: Family Medicine

## 2013-04-26 NOTE — Telephone Encounter (Signed)
Patient Information:  Caller Name: Kiyoshi  Phone: [phone]  Patient: [name] [name], [name] [name]  Gender: Male  DOB: Jun 26, 1965  Age: 48 Years  PCP: Carolann Littler (Family Practice)  Office Follow Up:  Does the office need to follow up with this patient?: No  Instructions For The Office: N/A   Symptoms  Reason For Call & Symptoms: Pt reports he has headache intermittent x 2 weeks, pt reports today is different  dizziness today. Pt states headache is over the eyes and ears feell full.  Reviewed Health History In EMR: Yes  Reviewed Medications In EMR: Yes  Reviewed Allergies In EMR: Yes  Reviewed Surgeries / Procedures: Yes  Date of Onset of Symptoms: 04/12/2013  Guideline(s) Used:  Sinus Pain and Congestion  Disposition Per Guideline:   Home Care  Reason For Disposition Reached:   Sinus congestion as part of a cold, present < 10 days  Advice Given:  Reassurance:   Sinus congestion is a normal part of a cold.  Usually home treatment with nasal washes can prevent an actual bacterial sinus infection.  Here is some care advice that should help.  For a Runny Nose With Profuse Discharge:  Nasal mucus and discharge helps to wash viruses and bacteria out of the nose and sinuses.  Blowing the nose is all that is needed.  If the skin around your nostrils gets irritated, apply a tiny amount of petroleum ointment to the nasal openings once or twice a day.  For a Stuffy Nose - Use Nasal Washes:  Methods: There are several ways to perform nasal irrigation. You can use a saline nasal spray bottle (available over-the-counter), a rubber ear syringe, a medical syringe without the needle, or a Neti Pot.  Step-By-Step Instructions:   Step 1: Lean over a sink.  Step 2: Gently squirt or spray warm salt water into one of your nostrils.  Step 3: Some of the water may run into the back of your throat. Spit this out. If you swallow the salt water it will not hurt you.  Step 4: Blow your nose to clean out  the water and mucus.  Step 5: Repeat steps 1-4 for the other nostril. You can do this a couple times a day if it seems to help you.  Pain and Fever Medicines:  Acetaminophen (e.g., Tylenol):  Regular Strength Tylenol: Take 650 mg (two 325 mg pills) by mouth every 4-6 hours as needed. Each Regular Strength Tylenol pill has 325 mg of acetaminophen.  Extra Strength Tylenol: Take 1,000 mg (two 500 mg pills) every 8 hours as needed. Each Extra Strength Tylenol pill has 500 mg of acetaminophen.  The most you should take each day is 3,000 mg (10 Regular Strength or 6 Extra Strength pills a day).  Ibuprofen (e.g., Motrin, Advil):  Take 400 mg (two 200 mg pills) by mouth every 6 hours.  Another choice is to take 600 mg (three 200 mg pills) by mouth every 8 hours.  The most you should take each day is 1,200 mg (six 200 mg pills), unless your doctor has told you to take more.  Hydration:  Drink plenty of liquids (6-8 glasses of water daily). If the air in your home is dry, use a cool mist humidifier  Expected Course:  Sinus congestion from viral upper respiratory infections (colds) usually lasts 5-10 days.  Occasionally a cold can worsen and turn into bacterial sinusitis. Clues to this are sinus symptoms lasting longer than 10 days, fever lasting longer than  3 days, and worsening pain. Bacterial sinusitis may need antibiotic treatment.  Call Back If:   Severe pain lasts longer than 2 hours after pain medicine  Sinus pain lasts longer than 1 day after starting treatment using nasal washes  Fever lasts longer than 3 days  You become worse.  Patient Will Follow Care Advice:  YES

## 2013-06-25 DIAGNOSIS — I472 Ventricular tachycardia: Secondary | ICD-10-CM

## 2013-06-25 DIAGNOSIS — I4729 Other ventricular tachycardia: Secondary | ICD-10-CM

## 2013-06-25 HISTORY — DX: Other ventricular tachycardia: I47.29

## 2013-06-25 HISTORY — DX: Ventricular tachycardia: I47.2

## 2013-07-10 ENCOUNTER — Encounter: Payer: Self-pay | Admitting: Physician Assistant

## 2013-07-10 ENCOUNTER — Encounter: Payer: Self-pay | Admitting: Internal Medicine

## 2013-07-10 ENCOUNTER — Ambulatory Visit (INDEPENDENT_AMBULATORY_CARE_PROVIDER_SITE_OTHER): Payer: BC Managed Care – PPO | Admitting: Physician Assistant

## 2013-07-10 VITALS — BP 160/80 | HR 74 | Temp 98.0°F | Resp 18 | Wt 195.0 lb

## 2013-07-10 DIAGNOSIS — R109 Unspecified abdominal pain: Secondary | ICD-10-CM

## 2013-07-10 LAB — CBC WITH DIFFERENTIAL/PLATELET
BASOS ABS: 0.1 10*3/uL (ref 0.0–0.1)
Basophils Relative: 0.7 % (ref 0.0–3.0)
EOS ABS: 0.1 10*3/uL (ref 0.0–0.7)
Eosinophils Relative: 0.7 % (ref 0.0–5.0)
HCT: 43.6 % (ref 39.0–52.0)
Hemoglobin: 14.6 g/dL (ref 13.0–17.0)
Lymphocytes Relative: 23.3 % (ref 12.0–46.0)
Lymphs Abs: 2.2 10*3/uL (ref 0.7–4.0)
MCHC: 33.5 g/dL (ref 30.0–36.0)
MCV: 92.9 fl (ref 78.0–100.0)
Monocytes Absolute: 0.7 10*3/uL (ref 0.1–1.0)
Monocytes Relative: 7.8 % (ref 3.0–12.0)
NEUTROS ABS: 6.4 10*3/uL (ref 1.4–7.7)
NEUTROS PCT: 67.5 % (ref 43.0–77.0)
Platelets: 315 10*3/uL (ref 150.0–400.0)
RBC: 4.69 Mil/uL (ref 4.22–5.81)
RDW: 13 % (ref 11.5–15.5)
WBC: 9.5 10*3/uL (ref 4.0–10.5)

## 2013-07-10 LAB — POCT URINALYSIS DIPSTICK
Bilirubin, UA: NEGATIVE
Glucose, UA: NEGATIVE
Leukocytes, UA: NEGATIVE
NITRITE UA: NEGATIVE
PH UA: 6
Protein, UA: NEGATIVE
RBC UA: NEGATIVE
Spec Grav, UA: 1.025
UROBILINOGEN UA: 0.2

## 2013-07-10 MED ORDER — NAPROXEN 500 MG PO TABS: 500.0000 mg | ORAL_TABLET | Freq: Two times a day (BID) | ORAL | Status: DC

## 2013-07-10 NOTE — Progress Notes (Signed)
Subjective:    Patient ID: [name] [name], male    DOB: 08-07-1965, 48 y.o.   MRN: [id number]  Abdominal Pain This is a new problem. The current episode started 1 to 4 weeks ago (off and on for about a year, worse for last week and a half.). The onset quality is undetermined. The problem occurs constantly. The problem has been unchanged. The pain is located in the LLQ. The pain is at a severity of 6/10. The pain is moderate. The quality of the pain is sharp. The abdominal pain does not radiate. Associated symptoms include frequency (has increased fluid intake) and headaches (hard to say if its associated). Pertinent negatives include no anorexia, arthralgias, belching, constipation (not constipated, regular, but is feeling bloated as if constipated.), diarrhea, dysuria, fever, flatus, hematochezia, hematuria, melena, myalgias, nausea, vomiting or weight loss. The pain is aggravated by palpation and certain positions. The pain is relieved by nothing. He has tried nothing for the symptoms. Prior workup: colonoscopy in 2012. His past medical history is significant for GERD and irritable bowel syndrome. There is no history of abdominal surgery, colon cancer, Crohn's disease, gallstones, pancreatitis, PUD or ulcerative colitis.      Review of Systems  Constitutional: Negative for fever, chills and weight loss.  Respiratory: Negative for shortness of breath.   Gastrointestinal: Positive for abdominal pain and abdominal distention (some days worse than others). Negative for nausea, vomiting, diarrhea, constipation (not constipated, regular, but is feeling bloated as if constipated.), blood in stool, melena, hematochezia, anal bleeding, rectal pain, anorexia and flatus.  Genitourinary: Positive for frequency (has increased fluid intake). Negative for dysuria and hematuria.  Musculoskeletal: Negative for arthralgias and myalgias.  Neurological: Positive for headaches (hard to say if its associated).  All  other systems reviewed and are negative.     Past Medical History  Diagnosis Date  . GERD 12/23/2009  . Pneumonia 10/2012  . Shortness of breath     "at rest" (11/22/2012)  . Daily headache     History   Social History  . Marital Status: Married    Spouse Name: N/A    Number of Children: 3  . Years of Education: N/A   Occupational History  . Music therapist   . OWNER    Social History Main Topics  . Smoking status: Current Every Day Smoker -- 1.00 packs/day for 23 years    Types: Cigarettes  . Smokeless tobacco: Never Used  . Alcohol Use: 16.8 oz/week    28 Shots of liquor per week     Comment: 11/21/2012 "couple shots in each drink; 2 mixed drinks/night"  . Drug Use: No  . Sexual Activity: Yes   Other Topics Concern  . Not on file   Social History Narrative   Patient does regular exercise.          Past Surgical History  Procedure Laterality Date  . Vasectomy  41's    Family History  Problem Relation Age of Onset  . Cancer Paternal Grandfather     prostate  . Stroke Paternal Grandfather   . Stomach cancer Paternal Uncle   . Diabetes Maternal Grandmother   . Colon polyps Father     No Known Allergies  Current Outpatient Prescriptions on File Prior to Visit  Medication Sig Dispense Refill  . aspirin EC 325 MG EC tablet Take 1 tablet (325 mg total) by mouth daily.  30 tablet  0  . omeprazole (PRILOSEC) 40 MG capsule Take 1 capsule (  40 mg total) by mouth 2 (two) times daily.  60 capsule  12   No current facility-administered medications on file prior to visit.    EXAM: BP 160/80  Pulse 74  Temp(Src) 98 F (36.7 C) (Oral)  Resp 18  Wt 195 lb (88.451 kg)  SpO2 98%     Objective:   Physical Exam  Nursing note and vitals reviewed. Constitutional: He is oriented to person, place, and time. He appears well-developed and well-nourished. No distress.  HENT:  Head: Normocephalic and atraumatic.  Eyes: Conjunctivae and EOM are normal.  Pupils are equal, round, and reactive to light.  Neck: Normal range of motion. Neck supple.  Cardiovascular: Normal rate, regular rhythm, normal heart sounds and intact distal pulses.  Exam reveals no gallop and no friction rub.   No murmur heard. Pulmonary/Chest: Effort normal and breath sounds normal. No respiratory distress. He has no wheezes. He has no rales. He exhibits no tenderness.  Abdominal: Soft. Bowel sounds are normal. He exhibits no distension and no mass. There is tenderness. There is no rebound and no guarding.  TTP in LLQ only. No radiation.  Musculoskeletal: Normal range of motion.  Neurological: He is alert and oriented to person, place, and time.  Skin: Skin is warm and dry. No rash noted. He is not diaphoretic. No erythema. No pallor.  Psychiatric: He has a normal mood and affect. His behavior is normal. Judgment and thought content normal.    Lab Results  Component Value Date   WBC 10.4 11/22/2012   HGB 15.8 11/22/2012   HCT 44.2 11/22/2012   PLT 277 11/22/2012   GLUCOSE 87 11/22/2012   CHOL 185 11/23/2012   TRIG 107 11/23/2012   HDL 83 11/23/2012   LDLCALC 81 11/23/2012   ALT 55* 11/22/2012   AST 54* 11/22/2012   NA 138 11/22/2012   K 4.5 11/22/2012   CL 100 11/22/2012   CREATININE 0.80 11/22/2012   BUN 19 11/22/2012   CO2 27 11/22/2012   TSH 1.15 12/23/2009         Assessment & Plan:  Cayman was seen today for abdominal pain.  Diagnoses and associated orders for this visit:  Abdominal pain Comments: LLQ, no mass, no FCNVD or other symptoms. Will check UA and CBC. Possibly Muscular in nature. Will try treatment with NSAIDs.  - naproxen (NAPROSYN) 500 MG tablet; Take 1 tablet (500 mg total) by mouth 2 (two) times daily with a meal. - POC Urinalysis Dipstick - CBC with Differential   Lab work may change treatment course.  Pt will monitor symptoms for worsening or different symptoms  Return precautions provided, and patient handout on abdominal  pain.  Plan to follow up as needed, or for worsening or persistent symptoms despite treatment.  Patient Instructions  We will call you with the results of your lab work when they're available.  Naprosyn 1 tab twice a day as needed for pain.  Continue to monitor your symptoms for worsening.  If emergency symptoms discussed during visit developed, seek medical attention immediately.  Followup as needed, or for worsening or persistent symptoms despite treatment.

## 2013-07-10 NOTE — Progress Notes (Signed)
Pre visit review using our clinic review tool, if applicable. No additional management support is needed unless otherwise documented below in the visit note. 

## 2013-07-10 NOTE — Patient Instructions (Signed)
We will call you with the results of your lab work when they're available.  Naprosyn 1 tab twice a day as needed for pain.  Continue to monitor your symptoms for worsening.  If emergency symptoms discussed during visit developed, seek medical attention immediately.  Followup as needed, or for worsening or persistent symptoms despite treatment.   Abdominal Pain, Adult Many things can cause belly (abdominal) pain. Most times, the belly pain is not dangerous. Many cases of belly pain can be watched and treated at home. HOME CARE   Do not take medicines that help you go poop (laxatives) unless told to by your doctor.  Only take medicine as told by your doctor.  Eat or drink as told by your doctor. Your doctor will tell you if you should be on a special diet. GET HELP IF:  You do not know what is causing your belly pain.  You have belly pain while you are sick to your stomach (nauseous) or have runny poop (diarrhea).  You have pain while you pee or poop.  Your belly pain wakes you up at night.  You have belly pain that gets worse or better when you eat.  You have belly pain that gets worse when you eat fatty foods. GET HELP RIGHT AWAY IF:   The pain does not go away within 2 hours.  You have a fever.  You keep throwing up (vomiting).  The pain changes and is only in the right or left part of the belly.  You have bloody or tarry looking poop. MAKE SURE YOU:   Understand these instructions.  Will watch your condition.  Will get help right away if you are not doing well or get worse. Document Released: 06/30/2007 Document Revised: 11/01/2012 Document Reviewed: 09/20/2012 [facility] Patient Information 2014 [place].

## 2013-07-11 ENCOUNTER — Telehealth: Payer: Self-pay | Admitting: Physician Assistant

## 2013-07-11 ENCOUNTER — Telehealth: Payer: Self-pay | Admitting: Internal Medicine

## 2013-07-11 MED ORDER — HYOSCYAMINE SULFATE 0.125 MG SL SUBL: SUBLINGUAL_TABLET | SUBLINGUAL | Status: DC

## 2013-07-11 NOTE — Telephone Encounter (Signed)
Spoke with patient and gave him recommendations. Rx sent to pharmacy.

## 2013-07-11 NOTE — Telephone Encounter (Signed)
Stop Naproxen if still taking it. Start LevsinSL.125 mg, #20 1 sling q 4 hours prn esophageal spasm, 1 refill

## 2013-07-11 NOTE — Telephone Encounter (Signed)
Called pt to deliver lab results. No abnormality with lab tests. Pt states he is feeling a little better today after taking NSAIDs, states the pain is not as bothersome. Plans to follow up as needed, or for worsening or persistent symptoms. Return precautions again provided. Pt was also noted to have elevated blood pressure at appointment, thought to be related to stress and pain per patient. Pt has been normotensive at [facility] in past. Encouraged pt to frequently check BPs at drugstore over the next few weeks to see if his numbers remain elevated, will reassess at follow up, or sooner if his numbers remain elevated despite treatment of pain symptoms.

## 2013-07-11 NOTE — Telephone Encounter (Signed)
Spoke with patient and he states he is having a twitching sensation in middle of his sternum. States he had an similar episode last year and was told it was an esophageal spasm. He is scheduled for OV next week but is asking if there is anything he can take until then. He did see his PCP yesterday for LLQ pain and was told it was muscular pain. Please, advise.

## 2013-07-12 ENCOUNTER — Encounter: Payer: Self-pay | Admitting: Family Medicine

## 2013-07-12 ENCOUNTER — Telehealth: Payer: Self-pay | Admitting: Family Medicine

## 2013-07-12 ENCOUNTER — Ambulatory Visit: Payer: BC Managed Care – PPO | Admitting: Family Medicine

## 2013-07-12 ENCOUNTER — Ambulatory Visit (INDEPENDENT_AMBULATORY_CARE_PROVIDER_SITE_OTHER): Payer: BC Managed Care – PPO | Admitting: Family Medicine

## 2013-07-12 VITALS — BP 139/86 | HR 59 | Temp 98.3°F | Ht 72.0 in | Wt 199.0 lb

## 2013-07-12 DIAGNOSIS — I499 Cardiac arrhythmia, unspecified: Secondary | ICD-10-CM

## 2013-07-12 DIAGNOSIS — K219 Gastro-esophageal reflux disease without esophagitis: Secondary | ICD-10-CM

## 2013-07-12 DIAGNOSIS — R002 Palpitations: Secondary | ICD-10-CM

## 2013-07-12 MED ORDER — METOPROLOL SUCCINATE ER 50 MG PO TB24: ORAL_TABLET | ORAL | Status: DC

## 2013-07-12 NOTE — Progress Notes (Signed)
   Subjective:    Patient ID: [name] [name], male    DOB: [birth year], 48 y.o.   MRN: [id number]  HPI Here for 2 days of intermittent fluttering in the chest. He had this almost all day yesterday and then it stopped last night. He felt fine this am and took his dog for a walk without event. However as soon as he got to work the fluttering started again. He describes this as a flutter or a quivering in the chest which comes about every minute or two, and it lasts about 3-5 seconds. There is no chest pain but he may have slight SOB at times. He has had slight nausea at times but no vomiting. He spoke to Dr. Nichola Sizer office yesterday thinking these were esophageal spasms, so she called in some Levsin for him to try. He only took one of these at about 8:30 this am, but it has not helped. No sweats, no arm numbness. He takes Prilosec daily and he denies any recent heartburn, although he had a lot of belching throughout the day yesterday. No difficulty swallowing. He saw Dr. Harrington Challenger for similar symptoms last October and he had a normal exam and a normal EKG. His ECHO then was normal except for some mild relaxation delays. He notes that he has been under a lot of stress lately, with work related stressors, the recent death of his mother-in-law, and his own mother's recent stroke. He also averages 4-5 cups of caffeinated coffee every morning.    Review of Systems  Constitutional: Negative.   Respiratory: Negative.   Cardiovascular: Positive for palpitations. Negative for chest pain and leg swelling.  Gastrointestinal: Negative.   Neurological: Negative.   Psychiatric/Behavioral: Negative for confusion, dysphoric mood, decreased concentration and agitation. The patient is nervous/anxious.        Objective:   Physical Exam  Constitutional: He is oriented to person, place, and time. He appears well-developed and well-nourished.  Neck: No thyromegaly present.  Cardiovascular: Normal rate, normal heart sounds  and intact distal pulses.  Exam reveals no gallop and no friction rub.   No murmur heard. He has frequent ectopic beats. On EKG he has some PVC's   Lymphadenopathy:    He has no cervical adenopathy.  Neurological: He is alert and oriented to person, place, and time.  Psychiatric: His behavior is normal. Thought content normal.  anxious          Assessment & Plan:  He is having PVC's and we had a long discussion about this. I reassured him that these are nor dangerous but can be uncomfortable. I believe his high stress levels and his caffeine use contribute to the problem. I advised him to cut back to no more than one cup of caffeinated coffee per day and he agreed. We will start him on a  low dose beta blocker and he will follow up with Dr. Elease Hashimoto, his PCP, in 2 weeks. Advised him to get daily walks and to do other things to get his stress under control.

## 2013-07-12 NOTE — Telephone Encounter (Signed)
Patient Information:  Caller Name: Davaun  Phone: [phone]  Patient: [name] [name], [name] [name]  Gender: Male  DOB: May 18, 1965  Age: 48 Years  PCP: Carolann Littler [facility])  Office Follow Up:  Does the office need to follow up with this patient?: No  Instructions For The Office: N/A   Symptoms  Reason For Call & Symptoms: Pt reports esophagus spasms with belching, headache.  Reviewed Health History In EMR: Yes  Reviewed Medications In EMR: Yes  Reviewed Allergies In EMR: Yes  Reviewed Surgeries / Procedures: Yes  Date of Onset of Symptoms: 07/11/2013  Guideline(s) Used:  Breathing Difficulty  Headache  Abdominal Pain - Male  No Protocol Available - Sick Adult  Disposition Per Guideline:   See Today in Office  Reason For Disposition Reached:   Patient wants to be seen  Advice Given:  N/A  Patient Will Follow Care Advice:  YES  Appointment Scheduled:  07/12/2013 11:00:00 Appointment Scheduled Provider:  Alysia [facility])

## 2013-07-12 NOTE — Progress Notes (Signed)
Pre visit review using our clinic review tool, if applicable. No additional management support is needed unless otherwise documented below in the visit note. 

## 2013-07-13 ENCOUNTER — Encounter (HOSPITAL_COMMUNITY): Payer: Self-pay | Admitting: Emergency Medicine

## 2013-07-13 ENCOUNTER — Inpatient Hospital Stay (HOSPITAL_COMMUNITY): Payer: BC Managed Care – PPO

## 2013-07-13 ENCOUNTER — Emergency Department (HOSPITAL_COMMUNITY): Payer: BC Managed Care – PPO

## 2013-07-13 ENCOUNTER — Inpatient Hospital Stay (HOSPITAL_COMMUNITY)
Admission: EM | Admit: 2013-07-13 | Discharge: 2013-07-13 | DRG: 310 | Disposition: A | Discharge status: Home / Self Care | Payer: BC Managed Care – PPO | Attending: Cardiology | Admitting: Cardiology

## 2013-07-13 ENCOUNTER — Telehealth: Payer: Self-pay | Admitting: Family Medicine

## 2013-07-13 DIAGNOSIS — Z9852 Vasectomy status: Secondary | ICD-10-CM

## 2013-07-13 DIAGNOSIS — F17200 Nicotine dependence, unspecified, uncomplicated: Secondary | ICD-10-CM | POA: Diagnosis present

## 2013-07-13 DIAGNOSIS — I4729 Other ventricular tachycardia: Principal | ICD-10-CM | POA: Diagnosis present

## 2013-07-13 DIAGNOSIS — I519 Heart disease, unspecified: Secondary | ICD-10-CM

## 2013-07-13 DIAGNOSIS — Z720 Tobacco use: Secondary | ICD-10-CM

## 2013-07-13 DIAGNOSIS — R079 Chest pain, unspecified: Secondary | ICD-10-CM

## 2013-07-13 DIAGNOSIS — K219 Gastro-esophageal reflux disease without esophagitis: Secondary | ICD-10-CM | POA: Diagnosis present

## 2013-07-13 DIAGNOSIS — Z79899 Other long term (current) drug therapy: Secondary | ICD-10-CM

## 2013-07-13 DIAGNOSIS — I4949 Other premature depolarization: Secondary | ICD-10-CM

## 2013-07-13 DIAGNOSIS — I209 Angina pectoris, unspecified: Secondary | ICD-10-CM

## 2013-07-13 DIAGNOSIS — Z823 Family history of stroke: Secondary | ICD-10-CM

## 2013-07-13 DIAGNOSIS — I472 Ventricular tachycardia, unspecified: Principal | ICD-10-CM | POA: Diagnosis present

## 2013-07-13 DIAGNOSIS — Z8042 Family history of malignant neoplasm of prostate: Secondary | ICD-10-CM

## 2013-07-13 DIAGNOSIS — I471 Supraventricular tachycardia, unspecified: Secondary | ICD-10-CM | POA: Diagnosis present

## 2013-07-13 DIAGNOSIS — Z800 Family history of malignant neoplasm of digestive organs: Secondary | ICD-10-CM

## 2013-07-13 DIAGNOSIS — Z833 Family history of diabetes mellitus: Secondary | ICD-10-CM

## 2013-07-13 DIAGNOSIS — I499 Cardiac arrhythmia, unspecified: Secondary | ICD-10-CM

## 2013-07-13 DIAGNOSIS — R072 Precordial pain: Secondary | ICD-10-CM

## 2013-07-13 DIAGNOSIS — Z8371 Family history of colonic polyps: Secondary | ICD-10-CM

## 2013-07-13 DIAGNOSIS — Z83719 Family history of colon polyps, unspecified: Secondary | ICD-10-CM

## 2013-07-13 LAB — I-STAT TROPONIN, ED: Troponin i, poc: 0 ng/mL (ref 0.00–0.08)

## 2013-07-13 LAB — BASIC METABOLIC PANEL
BUN: 12 mg/dL (ref 6–23)
BUN: 12 mg/dL (ref 6–23)
CO2: 21 mEq/L (ref 19–32)
CO2: 24 mEq/L (ref 19–32)
CREATININE: 0.79 mg/dL (ref 0.50–1.35)
Calcium: 10.1 mg/dL (ref 8.4–10.5)
Calcium: 9.7 mg/dL (ref 8.4–10.5)
Chloride: 101 mEq/L (ref 96–112)
Chloride: 102 mEq/L (ref 96–112)
Creatinine, Ser: 0.77 mg/dL (ref 0.50–1.35)
Glucose, Bld: 94 mg/dL (ref 70–99)
Glucose, Bld: 98 mg/dL (ref 70–99)
POTASSIUM: 4.5 meq/L (ref 3.7–5.3)
Potassium: 3.9 mEq/L (ref 3.7–5.3)
SODIUM: 140 meq/L (ref 137–147)
Sodium: 140 mEq/L (ref 137–147)

## 2013-07-13 LAB — CBC
HEMATOCRIT: 42 % (ref 39.0–52.0)
HEMOGLOBIN: 14.4 g/dL (ref 13.0–17.0)
MCH: 31.1 pg (ref 26.0–34.0)
MCHC: 34.3 g/dL (ref 30.0–36.0)
MCV: 90.7 fL (ref 78.0–100.0)
Platelets: 275 10*3/uL (ref 150–400)
RBC: 4.63 MIL/uL (ref 4.22–5.81)
RDW: 12.6 % (ref 11.5–15.5)
WBC: 8.6 10*3/uL (ref 4.0–10.5)

## 2013-07-13 LAB — CBC WITH DIFFERENTIAL/PLATELET
BASOS ABS: 0.1 10*3/uL (ref 0.0–0.1)
BASOS PCT: 1 % (ref 0–1)
Eosinophils Absolute: 0.3 10*3/uL (ref 0.0–0.7)
Eosinophils Relative: 3 % (ref 0–5)
HCT: 41.4 % (ref 39.0–52.0)
Hemoglobin: 14.4 g/dL (ref 13.0–17.0)
Lymphocytes Relative: 31 % (ref 12–46)
Lymphs Abs: 2.9 10*3/uL (ref 0.7–4.0)
MCH: 31.4 pg (ref 26.0–34.0)
MCHC: 34.8 g/dL (ref 30.0–36.0)
MCV: 90.4 fL (ref 78.0–100.0)
Monocytes Absolute: 0.9 10*3/uL (ref 0.1–1.0)
Monocytes Relative: 10 % (ref 3–12)
NEUTROS ABS: 5.2 10*3/uL (ref 1.7–7.7)
NEUTROS PCT: 55 % (ref 43–77)
Platelets: 299 10*3/uL (ref 150–400)
RBC: 4.58 MIL/uL (ref 4.22–5.81)
RDW: 12.7 % (ref 11.5–15.5)
WBC: 9.3 10*3/uL (ref 4.0–10.5)

## 2013-07-13 LAB — PROTIME-INR
INR: 0.95 (ref 0.00–1.49)
Prothrombin Time: 12.5 seconds (ref 11.6–15.2)

## 2013-07-13 LAB — HEMOGLOBIN A1C
Hgb A1c MFr Bld: 5.2 % (ref <5.7)
MEAN PLASMA GLUCOSE: 103 mg/dL (ref <117)

## 2013-07-13 LAB — LIPID PANEL
CHOLESTEROL: 204 mg/dL — AB (ref 0–200)
HDL: 62 mg/dL (ref >39)
LDL Cholesterol: 119 mg/dL — ABNORMAL HIGH (ref 0–99)
TRIGLYCERIDES: 113 mg/dL (ref <150)
Total CHOL/HDL Ratio: 3.3 RATIO
VLDL: 23 mg/dL (ref 0–40)

## 2013-07-13 LAB — MRSA PCR SCREENING: MRSA by PCR: NEGATIVE

## 2013-07-13 LAB — T4, FREE: FREE T4: 1.24 ng/dL (ref 0.80–1.80)

## 2013-07-13 LAB — MAGNESIUM: Magnesium: 2 mg/dL (ref 1.5–2.5)

## 2013-07-13 LAB — TSH: TSH: 2.12 u[IU]/mL (ref 0.350–4.500)

## 2013-07-13 LAB — PRO B NATRIURETIC PEPTIDE: Pro B Natriuretic peptide (BNP): 60.4 pg/mL (ref 0–125)

## 2013-07-13 MED ORDER — METOPROLOL TARTRATE 25 MG PO TABS: 25.0000 mg | ORAL_TABLET | Freq: Two times a day (BID) | ORAL | Status: DC

## 2013-07-13 MED ORDER — ENOXAPARIN SODIUM 40 MG/0.4ML ~~LOC~~ SOLN
40.0000 mg | SUBCUTANEOUS | Status: DC
  Filled 2013-07-13 (×2): qty 0.4

## 2013-07-13 MED ORDER — SODIUM CHLORIDE 0.9 % IJ SOLN: 3.0000 mL | INTRAMUSCULAR | Status: DC | PRN

## 2013-07-13 MED ORDER — SODIUM CHLORIDE 0.9 % IJ SOLN
3.0000 mL | Freq: Two times a day (BID) | INTRAMUSCULAR | Status: DC
  Administered 2013-07-13: 3 mL via INTRAVENOUS

## 2013-07-13 MED ORDER — TECHNETIUM TC 99M SESTAMIBI - CARDIOLITE
30.0000 | Freq: Once | INTRAVENOUS | Status: AC | PRN
  Administered 2013-07-13: 30 via INTRAVENOUS

## 2013-07-13 MED ORDER — REGADENOSON 0.4 MG/5ML IV SOLN
0.4000 mg | Freq: Once | INTRAVENOUS | Status: AC
  Administered 2013-07-13: 0.4 mg via INTRAVENOUS
  Filled 2013-07-13: qty 5

## 2013-07-13 MED ORDER — CARVEDILOL 6.25 MG PO TABS
6.2500 mg | ORAL_TABLET | Freq: Two times a day (BID) | ORAL | Status: DC
  Administered 2013-07-13: 6.25 mg via ORAL
  Filled 2013-07-13 (×3): qty 1

## 2013-07-13 MED ORDER — SODIUM CHLORIDE 0.9 % IV SOLN: 250.0000 mL | INTRAVENOUS | Status: DC | PRN

## 2013-07-13 MED ORDER — CARVEDILOL 6.25 MG PO TABS: 6.2500 mg | ORAL_TABLET | Freq: Two times a day (BID) | ORAL | Status: DC

## 2013-07-13 MED ORDER — NITROGLYCERIN 0.4 MG SL SUBL
0.4000 mg | SUBLINGUAL_TABLET | SUBLINGUAL | Status: DC | PRN
  Administered 2013-07-13 (×4): 0.4 mg via SUBLINGUAL
  Filled 2013-07-13: qty 1

## 2013-07-13 MED ORDER — PANTOPRAZOLE SODIUM 40 MG PO TBEC
40.0000 mg | DELAYED_RELEASE_TABLET | Freq: Every day | ORAL | Status: DC
  Administered 2013-07-13: 40 mg via ORAL
  Filled 2013-07-13: qty 1

## 2013-07-13 MED ORDER — ASPIRIN EC 81 MG PO TBEC: 81.0000 mg | DELAYED_RELEASE_TABLET | Freq: Every day | ORAL | Status: DC

## 2013-07-13 MED ORDER — NITROGLYCERIN 0.4 MG SL SUBL: 0.4000 mg | SUBLINGUAL_TABLET | SUBLINGUAL | Status: DC | PRN

## 2013-07-13 MED ORDER — CARVEDILOL 6.25 MG PO TABS
6.2500 mg | ORAL_TABLET | Freq: Two times a day (BID) | ORAL | Status: DC
  Filled 2013-07-13 (×3): qty 1

## 2013-07-13 MED ORDER — TECHNETIUM TC 99M SESTAMIBI GENERIC - CARDIOLITE
10.0000 | Freq: Once | INTRAVENOUS | Status: AC | PRN
  Administered 2013-07-13: 10 via INTRAVENOUS

## 2013-07-13 MED ORDER — REGADENOSON 0.4 MG/5ML IV SOLN
INTRAVENOUS | Status: AC
  Administered 2013-07-13: 0.4 mg via INTRAVENOUS
  Filled 2013-07-13: qty 5

## 2013-07-13 NOTE — ED Provider Notes (Signed)
CSN: [id number]     Arrival date & time 07/13/13  0039 History   First MD Initiated Contact with Patient 07/13/13 0040     Chief Complaint  Patient presents with  . Chest Pain      Patient is a 48 y.o. male presenting with chest pain. The history is provided by the patient.  Chest Pain Pain location:  Substernal area Pain quality: pressure   Pain radiates to:  Does not radiate Pain severity:  Moderate Onset quality:  Gradual Duration:  2 days Timing:  Intermittent Progression:  Worsening Relieved by:  Aspirin and nitroglycerin Worsened by:  Nothing tried Associated symptoms: vomiting   Associated symptoms: no fever and no syncope   pt presents for CP He reports episodes of CP for past 2 days Tonight the episode became much worse and he called EMS  Patient has been given ASA/NTG During transport, EMS noted pt was in V-tach (he was awake/alert) and was given amiodarone  Pt currently stable  Past Medical History  Diagnosis Date  . GERD 12/23/2009  . Pneumonia 10/2012  . Shortness of breath     "at rest" (11/22/2012)  . Daily headache    Past Surgical History  Procedure Laterality Date  . Vasectomy  37's   Family History  Problem Relation Age of Onset  . Cancer Paternal Grandfather     prostate  . Stroke Paternal Grandfather   . Stomach cancer Paternal Uncle   . Diabetes Maternal Grandmother   . Colon polyps Father    History  Substance Use Topics  . Smoking status: Current Every Day Smoker -- 1.00 packs/day for 23 years    Types: Cigarettes  . Smokeless tobacco: Never Used  . Alcohol Use: 16.8 oz/week    28 Shots of liquor per week     Comment: 11/21/2012 "couple shots in each drink; 2 mixed drinks/night"    Review of Systems  Constitutional: Negative for fever.  Cardiovascular: Positive for chest pain. Negative for syncope.  Gastrointestinal: Positive for vomiting.  Neurological: Negative for syncope.  All other systems reviewed and are  negative.     Allergies  Review of patient's allergies indicates no known allergies.  Home Medications   Prior to Admission medications   Medication Sig Start Date End Date Taking? Authorizing Hughie Melroy  aspirin EC 325 MG EC tablet Take 1 tablet (325 mg total) by mouth daily. 11/23/12   Belkys A Regalado, MD  hyoscyamine (LEVSIN/SL) 0.125 MG SL tablet Place 1 tablet (0.125 mg total) under the tongue every 4 (four) hours as needed for cramping. 10/15/10 10/25/10  Lafayette Dragon, MD  hyoscyamine (LEVSIN/SL) 0.125 MG SL tablet Place one sublingual every 4 hours prn esophageal spam. 07/11/13   Lafayette Dragon, MD  metoprolol succinate (TOPROL-XL) 50 MG 24 hr tablet Take with or immediately following a meal. 07/12/13   Laurey Morale, MD  omeprazole (PRILOSEC) 40 MG capsule Take 1 capsule (40 mg total) by mouth 2 (two) times daily. 12/08/12   Fay Records, MD   Ht 6' (1.829 m)  Wt 199 lb 1.2 oz (90.3 kg)  BMI 26.99 kg/m2 Physical Exam CONSTITUTIONAL: Well developed/well nourished HEAD: Normocephalic/atraumatic EYES: EOMI/PERRL ENMT: Mucous membranes moist NECK: supple no meningeal signs SPINE:entire spine nontender CV: S1/S2 noted, no murmurs/rubs/gallops noted LUNGS: Lungs are clear to auscultation bilaterally, no apparent distress ABDOMEN: soft, nontender, no rebound or guarding GU:no cva tenderness NEURO: Pt is awake/alert, moves all extremitiesx4 EXTREMITIES: pulses normal, full ROM SKIN:  warm, color normal PSYCH: no abnormalities of mood noted  ED Course  Procedures  CRITICAL CARE Performed by: Sharyon Cable Total critical care time: 31 Critical care time was exclusive of separately billable procedures and treating other patients. Critical care was necessary to treat or prevent imminent or life-threatening deterioration. Critical care was time spent personally by me on the following activities: development of treatment plan with patient and/or surrogate as well as nursing,  discussions with consultants, evaluation of patient's response to treatment, examination of patient, obtaining history from patient or surrogate, ordering and performing treatments and interventions, ordering and review of laboratory studies, ordering and review of radiographic studies, pulse oximetry and re-evaluation of patient's condition.  12:59 AM Pt stable Initial EKG non-ischemic I reviewed EMS EKG strips.  He had multiple PVCs, with one episode >8 beats Will consult cardiology 1:03 AM D/w dr Colon Flattery, cardiology will see patient Pt currently CP free at this time 1:35 AM Pt now with CP.  He is having PVCs .  He was given NTG 2:12 AM D/w dr Colon Flattery Will admit to cardiac unit as he has had some runs of nonsustained V-tach He has already been given amiodarone by [facility], ED    Imaging Review Dg Chest Portable 1 View  07/13/2013   CLINICAL DATA:  Ventricular tachycardia.  Substernal chest pain.  EXAM: PORTABLE CHEST - 1 VIEW  COMPARISON:  11/22/2012.  FINDINGS: Cardiopericardial silhouette within normal limits. Mediastinal contours normal. Trachea midline. No airspace disease or effusion. Monitoring leads project over the chest. Defibrillator pads overlie the chest.  IMPRESSION: No acute cardiopulmonary disease.   Electronically Signed   By: Dereck Ligas M.D.   On: 07/13/2013 01:12     EKG Interpretation   Date/Time:  Friday July 13 2013 00:45:59 EDT Ventricular Rate:  76 PR Interval:    QRS Duration: 81 QT Interval:  367 QTC Calculation: 413 R Axis:   87 Text Interpretation:  Sinus rhythm artifact noted Consider left  ventricular hypertrophy Otherwise no significant change Confirmed by  Christy Gentles  MD, Elenore Rota ([id number]) on 07/13/2013 12:54:11 AM      MDM   Final diagnoses:  Ischemic chest pain  Cardiac arrhythmia, unspecified cardiac arrhythmia type  Nonsustained ventricular  tachycardia    Nursing notes including past medical history and social history reviewed and considered in documentation xrays reviewed and considered Labs/vital reviewed and considered Previous records reviewed and considered     Sharyon Cable, MD 07/13/13 [phone]

## 2013-07-13 NOTE — Consult Note (Signed)
  Reason for Consult: Palpitations and NSVT  Referring Physician: Dr. Martinique  [name] [name] is an 48 y.o. male.   HPI: The patient is a 48 yo man with a h/o palpitations and sob without syncope who was admtted and found to have NSVT with a RBBB block pattern and inferior axis. He was initially tried on beta blockers. His echo was normal with preserved LV function. His stress test is pending and he ruled out for MI. His symptoms are improved. He denies peripheral edema but does admit to increase stress in his life. He does not have a family history of cardiac problems. No sudden death in the family history.   PMH: Past Medical History  Diagnosis Date  . GERD 12/23/2009  . Pneumonia 10/2012  . Shortness of breath     "at rest" (11/22/2012)  . Daily headache     PSHX: Past Surgical History  Procedure Laterality Date  . Vasectomy  19's    FAMHX: Family History  Problem Relation Age of Onset  . Cancer Paternal Grandfather     prostate  . Stroke Paternal Grandfather   . Stomach cancer Paternal Uncle   . Diabetes Maternal Grandmother   . Colon polyps Father     Social History:  reports that he has been smoking Cigarettes.  He has a 23 pack-year smoking history. He has never used smokeless tobacco. He reports that he drinks about 16.8 ounces of alcohol per week. He reports that he does not use illicit drugs.  Allergies: No Known Allergies  Medications: reviewed  Dg Chest Portable 1 View  07/13/2013   CLINICAL DATA:  Ventricular tachycardia.  Substernal chest pain.  EXAM: PORTABLE CHEST - 1 VIEW  COMPARISON:  11/22/2012.  FINDINGS: Cardiopericardial silhouette within normal limits. Mediastinal contours normal. Trachea midline. No airspace disease or effusion. Monitoring leads project over the chest. Defibrillator pads overlie the chest.  IMPRESSION: No acute cardiopulmonary disease.   Electronically Signed   By: Dereck Ligas M.D.   On: 07/13/2013 01:12    ROS  As stated in  the HPI and negative for all other systems.  Physical Exam  Vitals:Blood pressure 129/74, pulse 56, temperature 98.8 F (37.1 C), temperature source Oral, resp. rate 20, height 6' (1.829 m), weight 192 lb 14.4 oz (87.5 kg), SpO2 98.00%.  Well appearing middle aged man, NAD HEENT: Unremarkable Neck:  No JVD, no thyromegally Back:  No CVA tenderness Lungs:  Clear with no wheezes HEART:  Regular rate rhythm, no murmurs, no rubs, no clicks Abd:  Flat, positive bowel sounds, no organomegally, no rebound, no guarding Ext:  2 plus pulses, no edema, no cyanosis, no clubbing Skin:  No rashes no nodules Neuro:  CN II through XII intact, motor grossly intact  ECG - nsr with frequent PVC;s originating from the LV, possibly outflow tract  Assessment/Plan: 1. Symptomatic PVC's and NSVT in the setting preserved LV function who has been previously treated with diuretic therapy.  2. HTN Rec: If stress test is unrevealing and EF normal, he could be discharged home with outpatient followup. He may need to wear a 4 week monitor but for now would undergo watchful waiting. If palpitations increase then he could be started on flecainide.  Cristopher Peru, M.D.  Carleene Overlie TaylorMD 07/13/2013, 3:10 PM

## 2013-07-13 NOTE — Progress Notes (Signed)
Stress test normal.  Will Discharge and arrange follow up.  HAGER, BRYAN, PAC

## 2013-07-13 NOTE — Progress Notes (Signed)
  Echocardiogram 2D Echocardiogram has been performed.  Dekota Kirlin 07/13/2013, 2:16 PM

## 2013-07-13 NOTE — Telephone Encounter (Signed)
Relevant patient education assigned to patient using Emmi. ° °

## 2013-07-13 NOTE — ED Notes (Signed)
Patients heart rate fast and irregular. EKG captured and Md Wickline at bedside

## 2013-07-13 NOTE — H&P (Signed)
Cardiology History and Physical  Eulas Post, MD  History of Present Illness (and review of medical records): [name] [name] is a 48 y.o. male who presents for evaluation of palpitations.  He has no hx of MI or known CAD.  He was evaluated 11/2012 by Dr. Harrington Challenger for atypical chest pain and palpitations.  He now reports "flutterring" for the past 2 days.  This is associated with mid sternal chest discomfort.  His symptoms were initially short and fleeting.  On day of presentaitn, symptoms increased and became frequent and lasting longer.  He saw his PCP and was started on BB.  He took one dose and felt better for an hour or so, however, symptoms returned.  He then called EMS and was brought to ED.  He was given ASA.  He had run of NSVT en route and was given bolus of IV Amiodarone.  He has had some N/V in ED.  He had recurrent fluttering and chest discomfort in ED.  He was given 2 NTG with relief of his symptoms.    Previous diagnostic testing for coronary artery disease includes: echocardiogram. Previous history of cardiac disease includes Palpitations Diastolic Dysfunction. Coronary artery disease risk factors include: male gender and smoking/ tobacco exposure. Patient denies history of CHF, coronary artery disease, previous M.I. and valvular disease.  Review of Systems Recent abdominal/flank pain. Further review of systems was otherwise negative other than stated in HPI.  Patient Active Problem List   Diagnosis Date Noted  . Nonsustained paroxysmal supraventricular tachycardia 07/13/2013  . Chest pain 11/22/2012  . Tobacco abuse 11/22/2012  . GERD 12/23/2009  . DIARRHEA 12/23/2009   Past Medical History  Diagnosis Date  . GERD 12/23/2009  . Pneumonia 10/2012  . Shortness of breath     "at rest" (11/22/2012)  . Daily headache     Past Surgical History  Procedure Laterality Date  . Vasectomy  1990's    Prescriptions prior to admission  Medication Sig Dispense Refill  .  metoprolol succinate (TOPROL-XL) 50 MG 24 hr tablet Take 50 mg by mouth daily. Take with or immediately following a meal.      . omeprazole (PRILOSEC) 40 MG capsule Take 1 capsule (40 mg total) by mouth 2 (two) times daily.  60 capsule  12   No Known Allergies  History  Substance Use Topics  . Smoking status: Current Every Day Smoker -- 1.00 packs/day for 23 years    Types: Cigarettes  . Smokeless tobacco: Never Used  . Alcohol Use: 16.8 oz/week    28 Shots of liquor per week     Comment: 11/21/2012 "couple shots in each drink; 2 mixed drinks/night"    Family History  Problem Relation Age of Onset  . Cancer Paternal Grandfather     prostate  . Stroke Paternal Grandfather   . Stomach cancer Paternal Uncle   . Diabetes Maternal Grandmother   . Colon polyps Father      Objective:  Patient Vitals for the past 8 hrs:  BP Temp Pulse Resp SpO2 Height Weight  07/13/13 0400 - 98 F (36.7 C) - - - - -  07/13/13 0345 107/61 mmHg - 60 16 99 % - -  07/13/13 0319 111/58 mmHg - 73 20 98 % - -  07/13/13 0245 112/56 mmHg - 59 17 98 % - -  07/13/13 0230 108/51 mmHg - 64 18 99 % - -  07/13/13 0200 120/76 mmHg - 59 18 98 % - -  07/13/13  0138 139/88 mmHg - 73 24 100 % - -  07/13/13 0130 123/83 mmHg - 67 24 100 % - -  07/13/13 0110 123/79 mmHg - 62 21 100 % - -  07/13/13 0055 103/81 mmHg 98.2 F (36.8 C) 63 16 100 % - -  07/13/13 0043 - - - - - 6' (1.829 m) 90.3 kg (199 lb 1.2 oz)   General appearance: alert, cooperative, appears stated age and no distress Head: Normocephalic, without obvious abnormality, atraumatic Eyes: conjunctivae/corneas clear. PERRL, EOM's intact. Fundi benign. Neck: no carotid bruit, no JVD and supple, symmetrical, trachea midline Lungs: clear to auscultation bilaterally Chest wall: no tenderness Heart: regular rate and rhythm, S1, S2 normal,  Abdomen: soft, non-tender; bowel sounds normal; no masses,  no organomegaly Extremities: extremities normal, atraumatic, no  cyanosis or edema Pulses: 2+ and symmetric Neurologic: Grossly normal  Results for orders placed during the hospital encounter of 07/13/13 (from the past 48 hour(s))  BASIC METABOLIC PANEL     Status: None   Collection Time    07/13/13 12:47 AM      Result Value Ref Range   Sodium 140  137 - 147 mEq/L   Potassium 3.9  3.7 - 5.3 mEq/L   Chloride 101  96 - 112 mEq/L   CO2 21  19 - 32 mEq/L   Glucose, Bld 94  70 - 99 mg/dL   BUN 12  6 - 23 mg/dL   Creatinine, Ser 0.79  0.50 - 1.35 mg/dL   Calcium 10.1  8.4 - 10.5 mg/dL   GFR calc non Af Amer >90  >90 mL/min   GFR calc Af Amer >90  >90 mL/min   Comment: (NOTE)     The eGFR has been calculated using the CKD EPI equation.     This calculation has not been validated in all clinical situations.     eGFR's persistently <90 mL/min signify possible Chronic Kidney     Disease.  CBC WITH DIFFERENTIAL     Status: None   Collection Time    07/13/13 12:47 AM      Result Value Ref Range   WBC 9.3  4.0 - 10.5 K/uL   RBC 4.58  4.22 - 5.81 MIL/uL   Hemoglobin 14.4  13.0 - 17.0 g/dL   HCT 41.4  39.0 - 52.0 %   MCV 90.4  78.0 - 100.0 fL   MCH 31.4  26.0 - 34.0 pg   MCHC 34.8  30.0 - 36.0 g/dL   RDW 12.7  11.5 - 15.5 %   Platelets 299  150 - 400 K/uL   Neutrophils Relative % 55  43 - 77 %   Neutro Abs 5.2  1.7 - 7.7 K/uL   Lymphocytes Relative 31  12 - 46 %   Lymphs Abs 2.9  0.7 - 4.0 K/uL   Monocytes Relative 10  3 - 12 %   Monocytes Absolute 0.9  0.1 - 1.0 K/uL   Eosinophils Relative 3  0 - 5 %   Eosinophils Absolute 0.3  0.0 - 0.7 K/uL   Basophils Relative 1  0 - 1 %   Basophils Absolute 0.1  0.0 - 0.1 K/uL  I-STAT TROPOININ, ED     Status: None   Collection Time    07/13/13 12:49 AM      Result Value Ref Range   Troponin i, poc 0.00  0.00 - 0.08 ng/mL   Comment 3  Comment: Due to the release kinetics of cTnI,     a negative result within the first hours     of the onset of symptoms does not rule out     myocardial  infarction with certainty.     If myocardial infarction is still suspected,     repeat the test at appropriate intervals.   Dg Chest Portable 1 View  07/13/2013   CLINICAL DATA:  Ventricular tachycardia.  Substernal chest pain.  EXAM: PORTABLE CHEST - 1 VIEW  COMPARISON:  11/22/2012.  FINDINGS: Cardiopericardial silhouette within normal limits. Mediastinal contours normal. Trachea midline. No airspace disease or effusion. Monitoring leads project over the chest. Defibrillator pads overlie the chest.  IMPRESSION: No acute cardiopulmonary disease.   Electronically Signed   By: Dereck Ligas M.D.   On: 07/13/2013 01:12    ECG:  Sinus rhythm HR 76, no acute ischemic changes Telemetry strips: sinus rhythm with freq PVCs, short runs of NSVT.  Had 1-2 runs longer of 6-8 beats of NSVT.  Assessment: 41M with no hx of prior MI or known CAD who presents with chest pain, palpitations found to have runs of NSVT.  Plan: 1. Cardiology Admission  2. Continuous monitoring on Telemetry. 3. Repeat ekg on admit, prn chest pain or arrythmia 4. Trend cardiac biomarkers, check lipids, hgba1c, tsh 5. Medical management to include ASA,BB, NTG prn 6. Keep NPO @ MN for further evaluation, patient will need further assessment for underlying coronary artery disease and/or structural heart disease.

## 2013-07-13 NOTE — Progress Notes (Signed)
Pt called out C/o chest pain monitor with runs of V-tach 5 beats noted /monitor chest pain about 7 , stat EKG and sublingual NTG  0.4 given . EKG showed no changes  Tarri Fuller had written for D/C  Patient c/o chest again and NTG   0.4 was given and Gaspar Bidding was on floor and notified of pain from 9 now to 0 after last NTG given BP 140/94 waiting orders

## 2013-07-13 NOTE — ED Notes (Signed)
Patient denies any chest pain.  He is alert and oriented.  Remains on cardiac monitoring.  No runs of vtach noted at this time.  Awaiting bed assignment at this time

## 2013-07-13 NOTE — Discharge Summary (Signed)
Physician Discharge Summary     Cardiologist:  Harrington Challenger  Patient ID: [name] [name] MRN: [id number] DOB/AGE: 1965/10/14 48 y.o.  Admit date: 07/13/2013 Discharge date: 07/13/2013  Admission Diagnoses: NSVT  Discharge Diagnoses:  Active Problems:   Nonsustained paroxysmal supraventricular tachycardia   Discharged Condition: stable  Hospital Course:   [name] [name] is a 48 y.o. male who presents for evaluation of palpitations. He has no hx of MI or known CAD. He has a history of tobacco abuse.  He was evaluated 11/2012 by Dr. Harrington Challenger for atypical chest pain and palpitations. He now reports "flutterring" for the past 2 days. This is associated with mid sternal chest discomfort.  His symptoms were initially short and fleeting. On day of presentaitn, symptoms increased and became frequent and lasting longer. He saw his PCP and was started on BB. He took one dose and felt better for an hour or so, however, symptoms returned. He then called EMS and was brought to ED. He was given ASA. He had run of NSVT en route and was given bolus of IV Amiodarone. He has had some N/V in ED. He had recurrent fluttering and chest discomfort in ED. He was given 2 NTG with relief of his symptoms.   He was admitted and initial troponin was negtive.  He underwent nuclear stress testing which revealed no evidence of ischemia.  2D echo revealed normal EF, and LV.  Toprol was changed to coreg.  The patient was seen by Dr. Lovena Le who felt he was stable for DC home.  He also recommended starting flecainide it palpitations worsen.  Follow up arranged.  Consider further HR monitoring as an outpatient and titration of coreg.      Consults: EP  Significant Diagnostic Studies:  Echo Study Conclusions  - Left ventricle: The cavity size was normal. Wall thickness was normal. Systolic function was normal. The estimated ejection fraction was in the range of 55% to 60%. Wall motion was normal; there were no regional wall motion  abnormalities. Doppler parameters are consistent with abnormal left ventricular relaxation (grade 1 diastolic dysfunction). - Aortic root: The aortic root was mildly dilated. - Pulmonary arteries: Systolic pressure was mildly increased.  Impressions:  - Normal LV function; mildly elevated pulmonary pressures.    MYOCARDIAL IMAGING WITH SPECT (REST AND PHARMACOLOGIC-STRESS)  GATED LEFT VENTRICULAR WALL MOTION STUDY  LEFT VENTRICULAR EJECTION FRACTION  TECHNIQUE: Standard myocardial SPECT imaging was performed after resting intravenous injection of 10 mCi Tc-54m sestamibi. Subsequently, intravenous infusion of Lexiscan was performed under the supervision of the Cardiology staff. At peak effect of the drug, 30 mCi Tc-58m sestamibi was injected intravenously and standard myocardial SPECT imaging was performed. Quantitative gated imaging was also performed to evaluate left ventricular wall motion, and estimate left ventricular ejection fraction.  COMPARISON: Chest radiograph - earlier same day  FINDINGS: Review of the rotational raw images demonstrates no significant patient motion artifact or chest wall attenuation. Free technetium is seen within the thyroid gland on the provided rest images.  SPECT imaging demonstrates homogeneous distribution of injected radiotracer without scintigraphic evidence of prior infarction or pharmacologically induced ischemia.  Quantitative gated analysis demonstrates normal wall motion.  The resting left ventricular ejection fraction is 30% with end-diastolic volume of 865 ml and end-systolic volume of 46 ml.  IMPRESSION: 1. No scintigraphic evidence of prior infarction or pharmacologically induced ischemia. 2. Normal wall motion. Ejection fraction - 59%.    Treatments: See above  Discharge Exam: Blood pressure 129/74, pulse 56,  temperature 98.8 F (37.1 C), temperature source Oral, resp. rate 20, height 6' (1.829 m), weight 192 lb 14.4  oz (87.5 kg), SpO2 98.00%.   Disposition: 01-Home or Self Care  Discharge Instructions   Diet - low sodium heart healthy    Complete by:  As directed      Increase activity slowly    Complete by:  As directed             Medication List    STOP taking these medications       metoprolol succinate 50 MG 24 hr tablet  Commonly known as:  TOPROL-XL      TAKE these medications       carvedilol 6.25 MG tablet  Commonly known as:  COREG  Take 1 tablet (6.25 mg total) by mouth 2 (two) times daily with a meal.     omeprazole 40 MG capsule  Commonly known as:  PRILOSEC  Take 1 capsule (40 mg total) by mouth 2 (two) times daily.           Follow-up Information   Follow up with Lyda Jester, PA-C On 07/24/2013. (9:00 AM)    Specialty:  Cardiology   Contact information:   [address] [place] [address] [phone]       Signed: Tarri Fuller 07/13/2013, 4:36 PM

## 2013-07-13 NOTE — Progress Notes (Signed)
TELEMETRY: Reviewed telemetry pt in NSR with frequent PVCs and NSVT versus aberrancy: Filed Vitals:   07/13/13 0345 07/13/13 0400 07/13/13 0421 07/13/13 0804  BP: 107/61  126/85 127/78  Pulse: 60  65 56  Temp:  98 F (36.7 C) 98.1 F (36.7 C) 98.8 F (37.1 C)  TempSrc:   Oral Oral  Resp: 16  20   Height:   6' (1.829 m)   Weight:   192 lb 14.4 oz (87.5 kg)   SpO2: 99%  98% 98%   No intake or output data in the 24 hours ending 07/13/13 0926 Filed Weights   07/13/13 0043 07/13/13 0421  Weight: 199 lb 1.2 oz (90.3 kg) 192 lb 14.4 oz (87.5 kg)    Subjective Still having frequent palpitations. This is associated with symptoms of lightheadedness and feeling he may pass out. Also associated with localized mid sternal chest pain.   Derrill Memo ON 07/14/2013] aspirin EC  81 mg Oral Daily  . carvedilol  6.25 mg Oral BID WC  . enoxaparin (LOVENOX) injection  40 mg Subcutaneous Q24H  . pantoprazole  40 mg Oral Daily  . sodium chloride  3 mL Intravenous Q12H      LABS: Basic Metabolic Panel:  Recent Labs  07/13/13 0047 07/13/13 0535  NA 140 140  K 3.9 4.5  CL 101 102  CO2 21 24  GLUCOSE 94 98  BUN 12 12  CREATININE 0.79 0.77  CALCIUM 10.1 9.7  MG  --  2.0   Liver Function Tests: No results found for this basename: AST, ALT, ALKPHOS, BILITOT, PROT, ALBUMIN,  in the last 72 hours No results found for this basename: LIPASE, AMYLASE,  in the last 72 hours CBC:  Recent Labs  07/10/13 1358 07/13/13 0047 07/13/13 0535  WBC 9.5 9.3 8.6  NEUTROABS 6.4 5.2  --   HGB 14.6 14.4 14.4  HCT 43.6 41.4 42.0  MCV 92.9 90.4 90.7  PLT 315.0 299 275   Cardiac Enzymes: No results found for this basename: CKTOTAL, CKMB, CKMBINDEX, TROPONINI,  in the last 72 hours BNP:  Recent Labs  07/13/13 0535  PROBNP 60.4   D-Dimer: No results found for this basename: DDIMER,  in the last 72 hours Hemoglobin A1C: No results found for this basename: HGBA1C,  in the last 72 hours Fasting  Lipid Panel:  Recent Labs  07/13/13 0535  CHOL 204*  HDL 62  LDLCALC 119*  TRIG 113  CHOLHDL 3.3   Thyroid Function Tests:  Recent Labs  07/13/13 0535  TSH 2.120     Radiology/Studies:  Dg Chest Portable 1 View  07/13/2013   CLINICAL DATA:  Ventricular tachycardia.  Substernal chest pain.  EXAM: PORTABLE CHEST - 1 VIEW  COMPARISON:  11/22/2012.  FINDINGS: Cardiopericardial silhouette within normal limits. Mediastinal contours normal. Trachea midline. No airspace disease or effusion. Monitoring leads project over the chest. Defibrillator pads overlie the chest.  IMPRESSION: No acute cardiopulmonary disease.   Electronically Signed   By: Dereck Ligas M.D.   On: 07/13/2013 01:12   Ecg: normal.  PHYSICAL EXAM General: Well developed, well nourished, in no acute distress. Head: Normocephalic, atraumatic, sclera non-icteric, oropharynx is clear Neck: Negative for carotid bruits. JVD not elevated. No adenopathy Lungs: Clear bilaterally to auscultation without wheezes, rales, or rhonchi. Breathing is unlabored. Heart: RRR S1 S2 without murmurs, rubs, or gallops.  Abdomen: Soft, non-tender, non-distended with normoactive bowel sounds. No hepatomegaly. No rebound/guarding. No obvious abdominal masses. Msk:  Strength  and tone appears normal for age. Extremities: No clubbing, cyanosis or edema.  Distal pedal pulses are 2+ and equal bilaterally. Neuro: Alert and oriented X 3. Moves all extremities spontaneously. Psych:  Responds to questions appropriately with a normal affect.  ASSESSMENT AND PLAN: 1. Arrhythmia. Telemetry demonstrates frequent aberrant beats with irregular runs of aberrant beats. It is unclear to me whether this is NSVT versus supraventricular arrhythmia with aberrancy. Initial lab work unremarkable. Will update Echo. Plan stress myoview today. Will ask EP to see today. Continue beta blocker for now. Stressed importance of tobacco cessation and avoidance of caffeine.    2. Chest pain. Plan myoview today.  Present on Admission:  . Nonsustained paroxysmal supraventricular tachycardia  Signed, Holman Bonsignore [place], [place] 07/13/2013 9:26 AM

## 2013-07-13 NOTE — ED Notes (Signed)
Patient placed on Zoll

## 2013-07-13 NOTE — ED Notes (Signed)
Cardiology at bedside.

## 2013-07-13 NOTE — ED Notes (Addendum)
Patient has been experiencing CP x2 days. Today patient has been having runs of V-tach. Patient has n/v with symptoms. EMS gave 150 of amiodarone, 324 asa, 2 sl nitro and 4 of zofran. Patient reports pain went from 8/10 to 4/10

## 2013-07-16 ENCOUNTER — Encounter: Payer: Self-pay | Admitting: *Deleted

## 2013-07-18 ENCOUNTER — Ambulatory Visit (INDEPENDENT_AMBULATORY_CARE_PROVIDER_SITE_OTHER): Payer: BC Managed Care – PPO | Admitting: Internal Medicine

## 2013-07-18 ENCOUNTER — Ambulatory Visit: Payer: BC Managed Care – PPO | Admitting: Internal Medicine

## 2013-07-18 ENCOUNTER — Encounter: Payer: Self-pay | Admitting: Internal Medicine

## 2013-07-18 VITALS — BP 128/90 | HR 74 | Ht 72.0 in | Wt 192.0 lb

## 2013-07-18 DIAGNOSIS — R1032 Left lower quadrant pain: Secondary | ICD-10-CM

## 2013-07-18 MED ORDER — ALPRAZOLAM 0.25 MG PO TABS: 0.2500 mg | ORAL_TABLET | Freq: Two times a day (BID) | ORAL | Status: DC | PRN

## 2013-07-18 NOTE — Progress Notes (Signed)
[name] [name] December 28, 1965 [id number]  Note: This dictation was prepared with Dragon digital system. Any transcriptional errors that result from this procedure are unintentional.   History of Present Illness:  This is a 48 year old white male with acute left lower quadrant abdominal pain which started 2 weeks ago and subsided one week ago. He still has residual tenderness in the left lower quadrant. There was no fever or any change in bowel habits. There was no rectal bleeding. He has been under tremendous stress as a Metallurgist. He has also developed palpitations which were evaluated in the emergency room. He was admitted last week with ventricular tachycardia. He was seen by Dr. Lovena Le and had the appropriate evaluation. He has a return appointment next week. Patient was started on Carvediol 6.25 mg twice a day, but the palpitations have not subsided. He had a 20 minute run of palpitations this morning. Patient has a history of serrated adenoma of the cecal valve was found on a colonoscopy in September 2012. His father had colon polyps and he has occasional rectal bleeding due to hemorrhoids. He is due for a recall colonoscopy in September of this year.    Past Medical History  Diagnosis Date  . GERD 12/23/2009  . Pneumonia 10/2012  . Shortness of breath     "at rest" (11/22/2012)  . Daily headache   . Internal hemorrhoids   . Serrated adenoma of colon   . GERD (gastroesophageal reflux disease)   . IBS (irritable bowel syndrome)   . PVC (premature ventricular contraction)     Past Surgical History  Procedure Laterality Date  . Vasectomy  1990's    No Known Allergies  Family history and social history have been reviewed.  Review of Systems: Negative for dysphagia abdominal pain other than left lower quadrant  The remainder of the 10 point ROS is negative except as outlined in the H&P  Physical Exam: General Appearance Well developed, in no distress Eyes  Non icteric   HEENT  Non traumatic, normocephalic  Mouth No lesion, tongue papillated, no cheilosis Neck Supple without adenopathy, thyroid not enlarged, no carotid bruits, no JVD Lungs Clear to auscultation bilaterally COR Normal S1, normal S2, regular rhythm, no murmur, quiet precordium, intermittent premature beats Abdomen soft mildly tender in left lower quadrant. No palpable mass. Liver edge at costal margin. No distention Rectal soft Hemoccult negative stool Extremities  No pedal edema Skin No lesions Neurological Alert and oriented x 3 Psychological Normal mood and affect  Assessment and Plan:   Problem #87 48 year old white male with a new onset of palpitations diagnosed as ventricular tachycardia. He is in the process of evaluation and treatment. I have spoken to Dr. Lovena Le who suggested he double up on the Carvedilol to 12.5  mg twice a day. Because of the stress the patient is under, we will add Xanax 0.25 mg twice a day.  Problem #2 Left lower quadrant abdominal pain likely due to irritable bowel syndrome with colon spasm. He will use the Levsin sublingual 0.125 mg when necessary for left lower quadrant abdominal pain.  Problem #3 History of serrated adenoma of the ileocecal valve. Patient is due to have a colonoscopy in September 2015. We will send him a recall letter since I prefer that his palpitations have be resolved before scheduling the colonoscopy.     Delfin Edis 07/18/2013

## 2013-07-18 NOTE — Patient Instructions (Addendum)
We have sent the following medications to your pharmacy for you to pick up at your convenience: Xanax 0.25 mg-Take 1 tablet by mouth twice daily as needed  Per Dr Lovena Le, you may increase you carvedilol to 2 tablets (12.5 mg) twice daily until he sees you in the office on Tuesday.  You will be due for a recall colonoscopy in 09/2013. We will send you a reminder in the mail when it gets closer to that time.  CC:Dr Jearl Klinefelter, Dr Lovena Le

## 2013-07-24 ENCOUNTER — Ambulatory Visit (INDEPENDENT_AMBULATORY_CARE_PROVIDER_SITE_OTHER): Payer: BC Managed Care – PPO | Admitting: Cardiology

## 2013-07-24 ENCOUNTER — Encounter: Payer: Self-pay | Admitting: Cardiology

## 2013-07-24 VITALS — BP 133/87 | HR 65 | Ht 72.0 in | Wt 194.4 lb

## 2013-07-24 DIAGNOSIS — I472 Ventricular tachycardia: Secondary | ICD-10-CM

## 2013-07-24 DIAGNOSIS — I4729 Other ventricular tachycardia: Secondary | ICD-10-CM

## 2013-07-24 NOTE — Progress Notes (Signed)
Patient ID: [name] [name], male   DOB: 1965/08/12, 48 y.o.   MRN: [id number]    07/24/2013 [name] [name]   [birth year]  [id number]  Primary Physicia Eulas Post, MD Primary Cardiologist: Dr. Harrington [name]  [name] presents to clinic today for post hospital followup. Details regarding his history/recent hospital course are outlined in detail below.  HPI:  The patient is a 48 year old male, followed by Dr. Harrington [name] with a history of palpitations, who was admitted recently to [facility] with a complaint of persistent palpitations and found to have nonsustained VT, with a right bundle branch block pattern and inferior axis. He has no family history of cardiac problems and no family history of sudden cardiac death. He was initially tried on beta blockers (Toprol). A 2-D echo was obtained which was normal and demonstrated preserved left ventricular function. EF was 60-65%. He ruled out for MI with negative troponins x3. He also underwent a nuclear stress test which was negative for ischemia. He was seen and evaluated by electrophysiology. Dr. Lovena [name] recommended switching his Toprol to Coreg. He also recommended consideration of flecainide as an outpatient if he continued to have persistent palpitations. He was discharged home from the hospital on 07/13/2013.  He presents back to clinic today for post hospital followup. He states that several days after being discharged from the hospital, he developed recurrent palpitations. He also developed left lower quadrant pain and subsequently scheduled an appointment to see his PCP, Dr. Olevia [name]. She felt that his left lower quadrant abdominal pain was likely due to irritable bowel syndrome with colon spasm. She was prescribed Levsin, sublingual 0.125 mg, be used as needed. In regards to his palpitations, Dr. Olevia [name] contacted Dr. Lovena [name] via phone and instruction was given for him to increase his Coreg to 12.5 mg twice a day. Dr. Olevia [name] also felt that perhaps a lot of  his symptoms were possibly caused by anxiety. As a result, she prescribed Xanax, 0.25 mg, to be used twice a day as needed. Since these medication adjustments, the patient has noticed significant improvement. Since increasing his Coreg to 12.5 mg twice a day, he denies any further recurrence of palpitations. He also states that he has made significant lifestyle modifications, including decrease in caffeine intake as well as decrease in alcohol consumption.    Current Outpatient Prescriptions  Medication Sig Dispense Refill  . ALPRAZolam (XANAX) 0.25 MG tablet Take 1 tablet (0.25 mg total) by mouth 2 (two) times daily as needed for anxiety.  60 tablet  0  . ANDROGEL PUMP 20.25 MG/ACT (1.62%) GEL Apply 1 application topically daily.      . carvedilol (COREG) 6.25 MG tablet Take 2 tablets in the AM and 1 tablet in PM      . omeprazole (PRILOSEC) 40 MG capsule Take 40 mg by mouth daily.      . tadalafil (CIALIS) 20 MG tablet Take 20 mg by mouth daily as needed for erectile dysfunction.       No current facility-administered medications for this visit.    No Known Allergies  History   Social History  . Marital Status: Married    Spouse Name: N/A    Number of Children: 3  . Years of Education: N/A   Occupational History  . Music therapist   . OWNER    Social History Main Topics  . Smoking status: Former Smoker -- 1.00 packs/day for 23 years    Types: Cigarettes    Start date: 07/15/2013  .  Smokeless tobacco: Never Used  . Alcohol Use: 16.8 oz/week    28 Shots of liquor per week     Comment: 11/21/2012 "couple shots in each drink; 2 mixed drinks/night"  . Drug Use: No  . Sexual Activity: Yes   Other Topics Concern  . Not on file   Social History Narrative   Patient does regular exercise.           Review of Systems: General: negative for chills, fever, night sweats or weight changes.  Cardiovascular: negative for chest pain, dyspnea on exertion, edema, orthopnea,  palpitations, paroxysmal nocturnal dyspnea or shortness of breath Dermatological: negative for rash Respiratory: negative for cough or wheezing Urologic: negative for hematuria Abdominal: negative for nausea, vomiting, diarrhea, bright red blood per rectum, melena, or hematemesis Neurologic: negative for visual changes, syncope, or dizziness All other systems reviewed and are otherwise negative except as noted above.    Blood pressure 133/87, pulse 65, height 6' (1.829 m), weight 194 lb 6.4 oz (88.179 kg).  General appearance: alert, cooperative and no distress Neck: no carotid bruit and no JVD Lungs: clear to auscultation bilaterally Heart: regular rate and rhythm, S1, S2 normal, no murmur, click, rub or gallop Extremities: no LEE Pulses: 2+ and symmetric Skin: warm and dry Neurologic: Grossly normal  EKG not performed  ASSESSMENT AND PLAN:   1. Palpitations/NSVT: The patient denies any further recurrence since his Coreg was increased to 12.5 mg twice a day. Both his heart rate and blood pressure are stable at 65 beats per minute and 133/87 mmHg, respectively. We will continue him on his current dose. He has been instructed to notify our office if he develops recurrent palpitations. If so, we may need to further evaluate him with 30 day cardiac event monitor and/or referral back to see Dr. Lovena [name] for consideration of flecainide. He's been advised to continue to refrain from excessive caffeine intake and alcohol consumption. He has been prescribed Xanax, to be used as needed for anxiety, per his PCP, Dr. Olevia [name].  PLAN Continue beta blocker therapy for palpitations/NSVT. Followup with Dr. Harrington [name] as needed.  SIMMONS, BRITTAINYPA-C 07/24/2013 9:46 AM

## 2013-07-24 NOTE — Patient Instructions (Signed)
Your physician recommends that you schedule a follow-up appointment As Needed  

## 2013-08-13 ENCOUNTER — Encounter: Payer: Self-pay | Admitting: Internal Medicine

## 2013-08-14 ENCOUNTER — Encounter: Payer: Self-pay | Admitting: Internal Medicine

## 2013-08-28 ENCOUNTER — Encounter: Payer: Self-pay | Admitting: Internal Medicine

## 2013-08-28 ENCOUNTER — Other Ambulatory Visit: Payer: Self-pay

## 2013-08-28 MED ORDER — CARVEDILOL 6.25 MG PO TABS: ORAL_TABLET | ORAL | Status: DC

## 2013-09-18 ENCOUNTER — Other Ambulatory Visit: Payer: Self-pay | Admitting: Internal Medicine

## 2013-09-21 ENCOUNTER — Ambulatory Visit (AMBULATORY_SURGERY_CENTER): Payer: Self-pay | Admitting: *Deleted

## 2013-09-21 VITALS — Ht 72.0 in | Wt 201.0 lb

## 2013-09-21 DIAGNOSIS — Z86010 Personal history of colonic polyps: Secondary | ICD-10-CM

## 2013-09-21 MED ORDER — MOVIPREP 100 G PO SOLR: 1.0000 | Freq: Once | ORAL | Status: DC

## 2013-09-21 NOTE — Progress Notes (Signed)
Patient states he is no longer having palpitations since his medicine was increased when he saw Dr. Olevia Perches.

## 2013-09-21 NOTE — Progress Notes (Signed)
Denies allergies to eggs or soy products. Denies complications with sedation or anesthesia. Denies O2 use. Denies use of diet or weight loss medications.  Emmi instructions given for colonoscopy.  

## 2013-09-27 ENCOUNTER — Encounter: Payer: Self-pay | Admitting: Internal Medicine

## 2013-10-08 ENCOUNTER — Encounter: Payer: Self-pay | Admitting: Internal Medicine

## 2013-10-08 ENCOUNTER — Ambulatory Visit (AMBULATORY_SURGERY_CENTER): Payer: BC Managed Care – PPO | Admitting: Internal Medicine

## 2013-10-08 VITALS — BP 138/81 | HR 59 | Temp 98.5°F | Resp 22 | Ht 72.0 in | Wt 201.0 lb

## 2013-10-08 DIAGNOSIS — Z860100 Personal history of colon polyps, unspecified: Secondary | ICD-10-CM

## 2013-10-08 DIAGNOSIS — D126 Benign neoplasm of colon, unspecified: Secondary | ICD-10-CM

## 2013-10-08 DIAGNOSIS — D129 Benign neoplasm of anus and anal canal: Secondary | ICD-10-CM

## 2013-10-08 DIAGNOSIS — D122 Benign neoplasm of ascending colon: Secondary | ICD-10-CM

## 2013-10-08 DIAGNOSIS — D120 Benign neoplasm of cecum: Secondary | ICD-10-CM

## 2013-10-08 DIAGNOSIS — D128 Benign neoplasm of rectum: Secondary | ICD-10-CM

## 2013-10-08 MED ORDER — SODIUM CHLORIDE 0.9 % IV SOLN: 500.0000 mL | INTRAVENOUS | Status: DC

## 2013-10-08 NOTE — Progress Notes (Signed)
Called to room to assist during endoscopic procedure.  Patient ID and intended procedure confirmed with present staff. Received instructions for my participation in the procedure from the performing physician.  

## 2013-10-08 NOTE — Patient Instructions (Signed)
YOU HAD AN ENDOSCOPIC PROCEDURE TODAY AT THE [facility]: Refer to the procedure report that was given to you for any specific questions about what was found during the examination.  If the procedure report does not answer your questions, please call your gastroenterologist to clarify.  If you requested that your care partner not be given the details of your procedure findings, then the procedure report has been included in a sealed envelope for you to review at your convenience later.  YOU SHOULD EXPECT: Some feelings of bloating in the abdomen. Passage of more gas than usual.  Walking can help get rid of the air that was put into your GI tract during the procedure and reduce the bloating. If you had a lower endoscopy (such as a colonoscopy or flexible sigmoidoscopy) you may notice spotting of blood in your stool or on the toilet paper. If you underwent a bowel prep for your procedure, then you may not have a normal bowel movement for a few days.  DIET: Your first meal following the procedure should be a light meal and then it is ok to progress to your normal diet.  A half-sandwich or bowl of soup is an example of a good first meal.  Heavy or fried foods are harder to digest and may make you feel nauseous or bloated.  Likewise meals heavy in dairy and vegetables can cause extra gas to form and this can also increase the bloating.  Drink plenty of fluids but you should avoid alcoholic beverages for 24 hours.  ACTIVITY: Your care partner should take you home directly after the procedure.  You should plan to take it easy, moving slowly for the rest of the day.  You can resume normal activity the day after the procedure however you should NOT DRIVE or use heavy machinery for 24 hours (because of the sedation medicines used during the test).    SYMPTOMS TO REPORT IMMEDIATELY: A gastroenterologist can be reached at any hour.  During normal business hours, 8:30 AM to 5:00 PM Monday through Friday,  call [phone].  After hours and on weekends, please call the GI answering service at [phone] who will take a message and have the physician on call contact you.   Following lower endoscopy (colonoscopy or flexible sigmoidoscopy):  Excessive amounts of blood in the stool  Significant tenderness or worsening of abdominal pains  Swelling of the abdomen that is new, acute  Fever of 100F or higher    FOLLOW UP: If any biopsies were taken you will be contacted by phone or by letter within the next 1-3 weeks.  Call your gastroenterologist if you have not heard about the biopsies in 3 weeks.  Our staff will call the home number listed on your records the next business day following your procedure to check on you and address any questions or concerns that you may have at that time regarding the information given to you following your procedure. This is a courtesy call and so if there is no answer at the home number and we have not heard from you through the emergency physician on call, we will assume that you have returned to your regular daily activities without incident.  SIGNATURES/CONFIDENTIALITY: You and/or your care partner have signed paperwork which will be entered into your electronic medical record.  These signatures attest to the fact that that the information above on your After Visit Summary has been reviewed and is understood.  Full responsibility of the confidentiality   of this discharge information lies with you and/or your care-partner.     

## 2013-10-08 NOTE — Op Note (Signed)
[place]  [address]. [place], [id number]   COLONOSCOPY PROCEDURE REPORT  PATIENT: [name] [name], [name] [name]  MR#: [id number] BIRTHDATE: 12/02/1965 , 48  yrs. old GENDER: Male ENDOSCOPIST: Lafayette Dragon, MD REFERRED IF:OYDXA Burchette, M.D. PROCEDURE DATE:  10/08/2013 PROCEDURE:   Colonoscopy with snare polypectomy and Colonoscopy with cold biopsy polypectomy First Screening Colonoscopy - Avg.  risk and is 50 yrs.  old or older - No.  Prior Negative Screening - Now for repeat screening. N/A  History of Adenoma - Now for follow-up colonoscopy & has been > or = to 3 yrs.  Yes hx of adenoma.  Has been 3 or more years since last colonoscopy.  Polyps Removed Today? Yes. ASA CLASS:   Class I INDICATIONS:serrated adenoma removed on colonoscopy in September 2012.  Polyp was located on the ileocecal valve.  Positive family history of colon polyps. MEDICATIONS: MAC sedation, administered by CRNA, Propofol (Diprivan), and Propofol (Diprivan) 550 mg IV  DESCRIPTION OF PROCEDURE:   After the risks benefits and alternatives of the procedure were thoroughly explained, informed consent was obtained.  A digital rectal exam revealed no abnormalities of the rectum.   The LB [id number]  endoscope was introduced through the anus and advanced to the cecum, which was identified by both the appendix and ileocecal valve. No adverse events experienced.   The quality of the prep was good, using MoviPrep  The instrument was then slowly withdrawn as the colon was fully examined.      COLON FINDINGS: Five sessile polyps were found at the ileocecal valve, in the ascending colon, and rectum.  A polypectomy was performed with cold forceps and with a cold snare.  The resection was complete and the polyp tissue was completely retrieved. Retroflexed views revealed no abnormalities. The time to cecum=13 minutes 14 seconds.  Withdrawal time=15 minutes 41 seconds.  The scope was withdrawn and  the procedure completed. COMPLICATIONS: There were no complications.  ENDOSCOPIC IMPRESSION: Four sessile polyps were found at the ileocecal valve- carpeted 2 cm polyp previously removed on last colonoscopy, now extending  over most of the ileocecal valve, biopsies taken, second polyp ascending colon 4 mm removed with cold biopsy, third polyp next to it  3 mm not removed due to difficult location behind a fold, fourth polyp in the rectum 7 mm removed with cold snare, 5th polyp -rectal removed with cold biopsy. in the ascending colon, and rectum; polypectomy was performed with cold forceps and with a cold snare  RECOMMENDATIONS: 1.  Await pathology results 2.  high fiber diet carpeted polyp  on ileocecal valve will likely be as serrated adenoma again and I will suggest to have this resected surgically since it is rather flat and impossible to remove without compromising the ileocecal valve, also likely to recur is removed only on the surface.   eSigned:  Lafayette Dragon, MD 10/08/2013 11:54 AM   cc:   PATIENT NAME:  [name], [name] MR#: [id number]

## 2013-10-08 NOTE — Progress Notes (Signed)
A/ox3 pleased with MAC, report to Karen RN 

## 2013-10-09 ENCOUNTER — Telehealth: Payer: Self-pay | Admitting: *Deleted

## 2013-10-09 NOTE — Telephone Encounter (Signed)
Name identifier, left message, follow-up 

## 2013-10-11 ENCOUNTER — Encounter: Payer: Self-pay | Admitting: Internal Medicine

## 2013-10-19 ENCOUNTER — Telehealth: Payer: Self-pay | Admitting: Internal Medicine

## 2013-10-19 NOTE — Telephone Encounter (Signed)
Patient states he received his path results letter. He thought he had one polyp that he would need to see a surgeon to remove. Does he need to see a surgeon or just have a 5 year f/u colonoscopy?

## 2013-10-22 ENCOUNTER — Other Ambulatory Visit: Payer: Self-pay | Admitting: Internal Medicine

## 2013-10-23 NOTE — Telephone Encounter (Signed)
I have spoken to the patient as well to the pathologist who read the polyp report in September 2012 [facility] in 2015 and scan to compare the tissue as to whether this is hyperplastic or serrated adenoma I will get back to the patient concerning further disposition.

## 2013-11-01 NOTE — Telephone Encounter (Signed)
error 

## 2013-12-06 ENCOUNTER — Other Ambulatory Visit: Payer: Self-pay | Admitting: Internal Medicine

## 2013-12-12 ENCOUNTER — Telehealth: Payer: Self-pay | Admitting: Family Medicine

## 2013-12-12 NOTE — Telephone Encounter (Signed)
Recommend follow up to discuss.  Controlled meds can come through one provider only and need to discuss indications/long term management, etc.

## 2013-12-12 NOTE — Telephone Encounter (Signed)
Pt has been scheduled.  °

## 2013-12-12 NOTE — Telephone Encounter (Signed)
Last visit 07/12/13 Saw Dr. Sarajane Jews Last refill 10/22/13 #60 0 refill

## 2013-12-12 NOTE — Telephone Encounter (Signed)
Pt was getting the following rx  filled  By Dr Olevia Perches  ALPRAZolam Duanne Moron) 0.25 MG tablet. Was told for any more refills to contact PCP .

## 2013-12-12 NOTE — Telephone Encounter (Signed)
Can you please schedule appt?

## 2013-12-14 ENCOUNTER — Encounter: Payer: Self-pay | Admitting: Family Medicine

## 2013-12-14 ENCOUNTER — Ambulatory Visit (INDEPENDENT_AMBULATORY_CARE_PROVIDER_SITE_OTHER): Payer: BC Managed Care – PPO | Admitting: Family Medicine

## 2013-12-14 VITALS — BP 130/80 | HR 70 | Temp 98.2°F | Wt 200.0 lb

## 2013-12-14 DIAGNOSIS — F418 Other specified anxiety disorders: Secondary | ICD-10-CM

## 2013-12-14 MED ORDER — ALPRAZOLAM 0.25 MG PO TABS: 0.2500 mg | ORAL_TABLET | Freq: Two times a day (BID) | ORAL | Status: DC | PRN

## 2013-12-14 NOTE — Progress Notes (Signed)
   Subjective:    Patient ID: [name] [name], male    DOB: 1965/03/21, 48 y.o.   MRN: [id number]  HPI Patient seen to discuss anxiety issues. He's had some chronic GI complaints and saw gastrologist this past summer also had frequent palpitations. He's had recent colonoscopy and presumably IBS. He has palpitations with PVCs controlled with carvedilol.  Started per GI this summer on alprazolam 0.25 mg twice daily as needed and he states is only taking about 3 or 4 per week but he had 60 filled in the September and has almost run out. No history of panic disorder. Increased work stress but no other specific stressors.  Past Medical History  Diagnosis Date  . GERD 12/23/2009  . Pneumonia 10/2012  . Shortness of breath     "at rest" (11/22/2012)  . Daily headache   . Internal hemorrhoids   . Serrated adenoma of colon   . GERD (gastroesophageal reflux disease)   . IBS (irritable bowel syndrome)   . PVC (premature ventricular contraction)   . Nonsustained ventricular tachycardia 06/2013   Past Surgical History  Procedure Laterality Date  . Vasectomy  1990's    reports that he has been smoking Cigarettes.  He has a 11.5 pack-year smoking history. He has never used smokeless tobacco. He reports that he drinks about 16.8 oz of alcohol per week. He reports that he does not use illicit drugs. family history includes Colon polyps in his father; Diabetes in his maternal grandmother; Prostate cancer in his paternal grandfather; Stomach cancer in his paternal uncle; Stroke in his paternal grandfather. There is no history of Colon cancer, Esophageal cancer, Rectal cancer, or Pancreatic cancer. No Known Allergies    Review of Systems  Constitutional: Negative for unexpected weight change.  Respiratory: Negative for shortness of breath.   Cardiovascular: Negative for chest pain.  Psychiatric/Behavioral: The patient is nervous/anxious.        Objective:   Physical Exam  Constitutional: He appears  well-developed and well-nourished.  Cardiovascular: Normal rate and regular rhythm.  Exam reveals no gallop.   No murmur heard. Pulmonary/Chest: Effort normal and breath sounds normal. No respiratory distress. He has no wheezes. He has no rales.  Psychiatric: He has a normal mood and affect. His behavior is normal.          Assessment & Plan:  Situational anxiety. We recommended against regular use alprazolam. We explained if symptoms are severe we would consider daily use of SSRI to suppress. We have recommend tapering back Xanax. We agreed to 0.25 mgs #30 and he'll use rarely for severe symptoms. We also discussed nonpharmacologic ways of trying to manage stress and anxiety and try to get back to more regular exercise

## 2013-12-14 NOTE — Progress Notes (Signed)
Pre visit review using our clinic review tool, if applicable. No additional management support is needed unless otherwise documented below in the visit note. 

## 2013-12-18 ENCOUNTER — Telehealth: Payer: Self-pay | Admitting: Family Medicine

## 2013-12-18 NOTE — Telephone Encounter (Signed)
emmi emailed °

## 2013-12-31 ENCOUNTER — Other Ambulatory Visit: Payer: Self-pay | Admitting: Internal Medicine

## 2014-02-17 ENCOUNTER — Other Ambulatory Visit: Payer: Self-pay | Admitting: Internal Medicine

## 2014-02-28 ENCOUNTER — Other Ambulatory Visit: Payer: Self-pay | Admitting: Internal Medicine

## 2014-10-01 ENCOUNTER — Other Ambulatory Visit: Payer: Self-pay | Admitting: Internal Medicine

## 2014-10-18 IMAGING — CT CT HEAD W/O CM
1 series · 16 of 30 positions shown, 20 images · non-contrast
Comparison: MRI brain 12/22/2010.

CLINICAL DATA: History that the patient after having chest pain
stood up and became dizzy and collapsed to the floor. Also history
of intermittent headaches.

EXAM:
CT HEAD WITHOUT CONTRAST
TECHNIQUE: Contiguous axial images were obtained from the base of the skull
through the vertex without intravenous contrast.

[Series 2: head 5.0 h30s · axial · 0.43mm/px · z∈[+1301,+1441]mm · 16 of 32 slices shown, 20 images]
[im 2/32  brain]
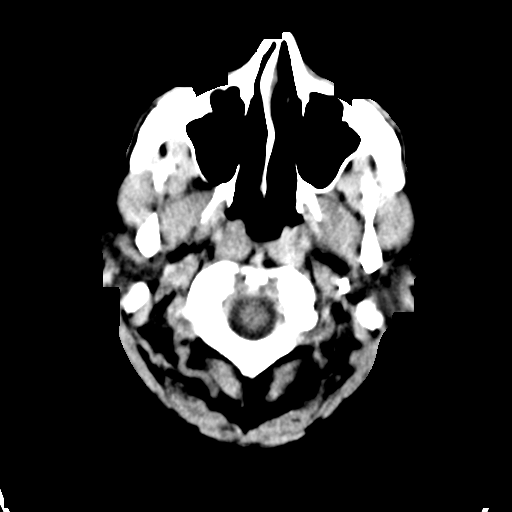
[im 2/32  bone]
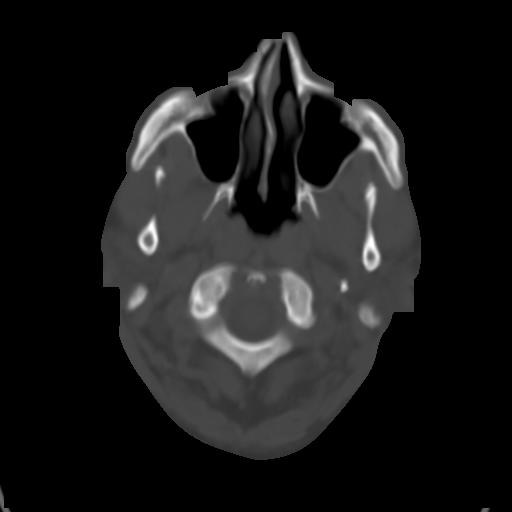
[im 4/32  brain]
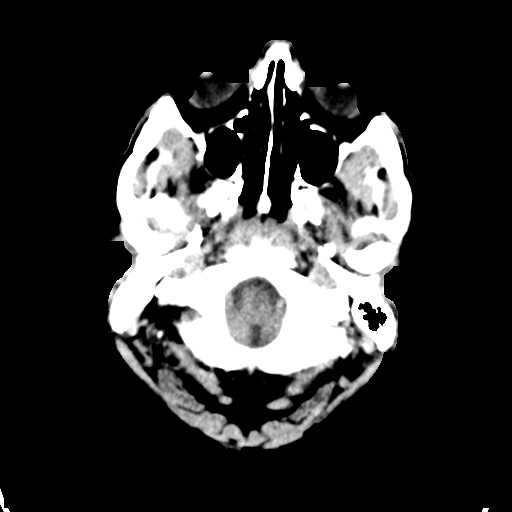
[im 6/32  brain]
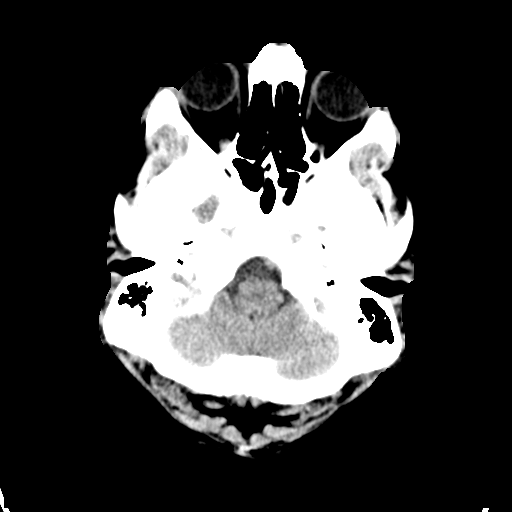
[im 8/32  brain]
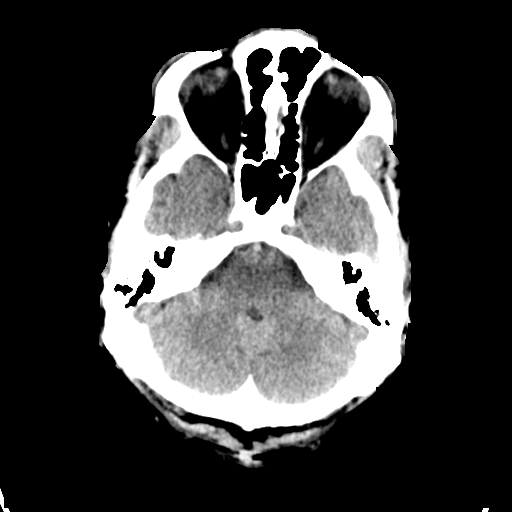
[im 9/32  brain]
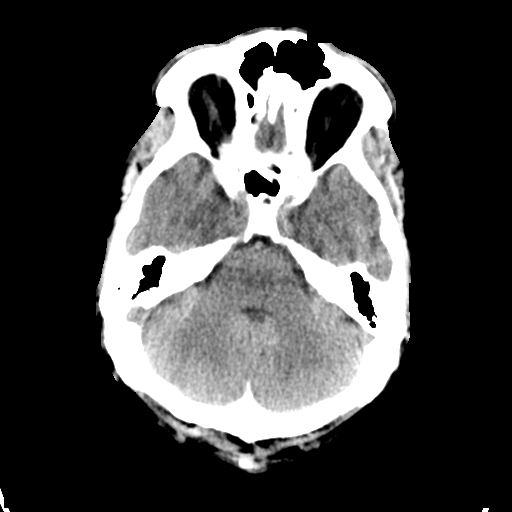
[im 9/32  bone]
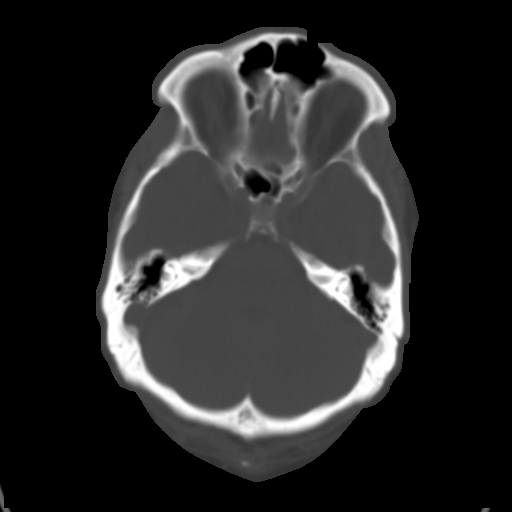
[im 11/32  brain]
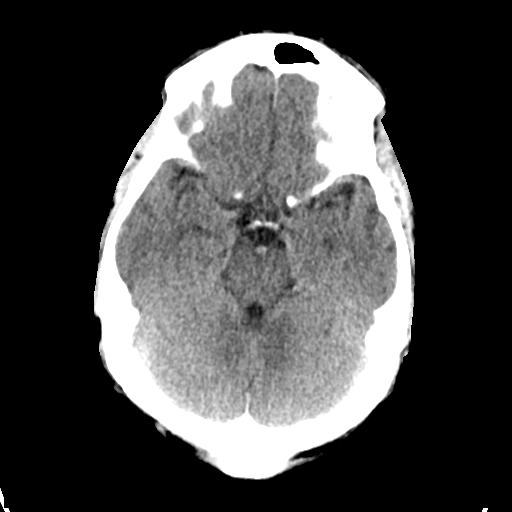
[im 13/32  brain]
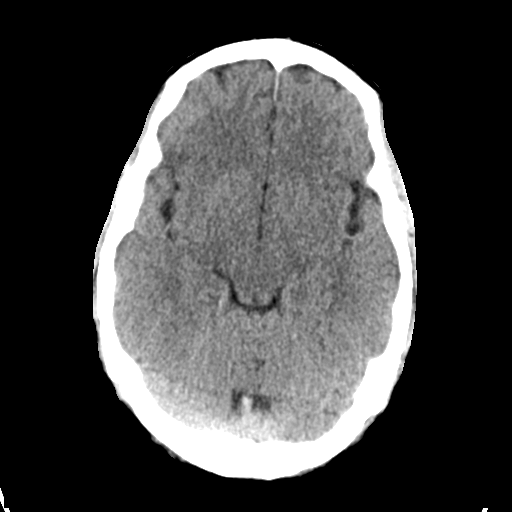
[im 15/32  brain]
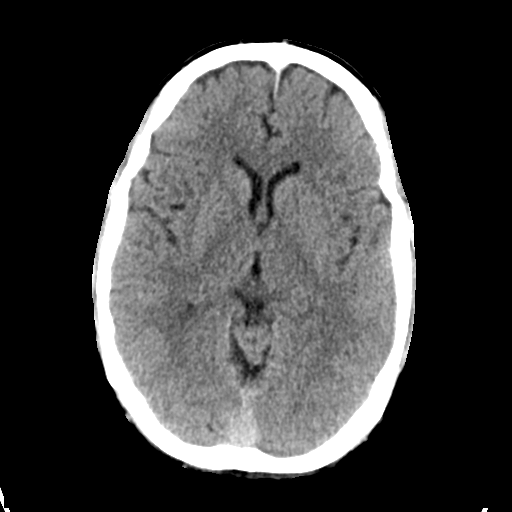
[im 17/32  brain]
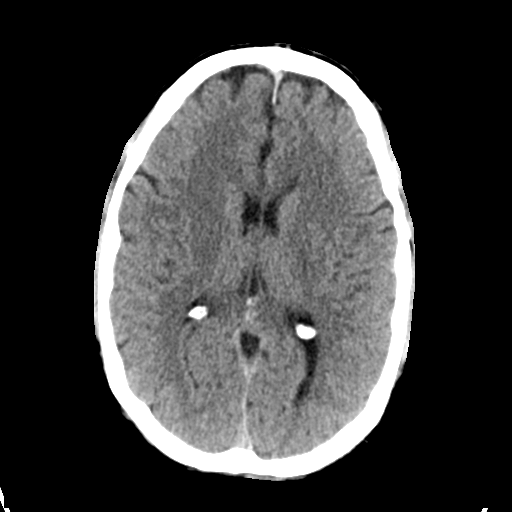
[im 17/32  bone]
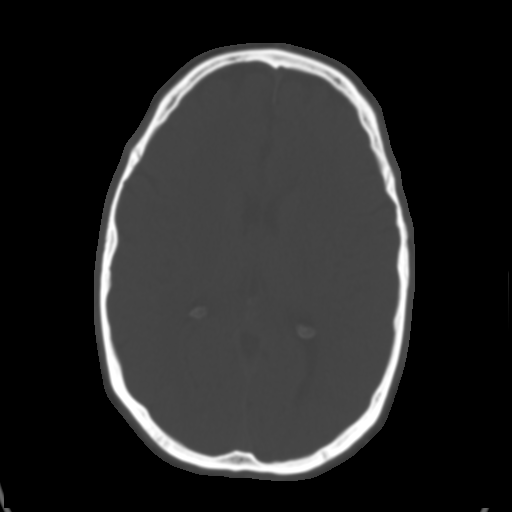
[im 19/32  brain]
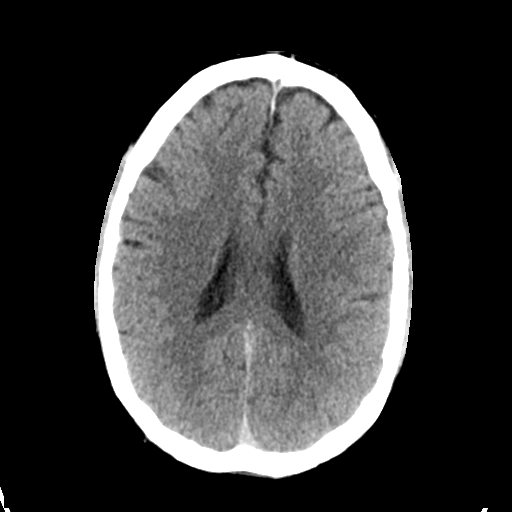
[im 21/32  brain]
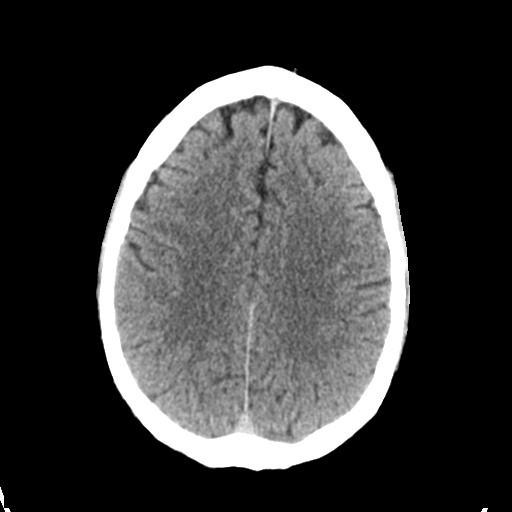
[im 23/32  brain]
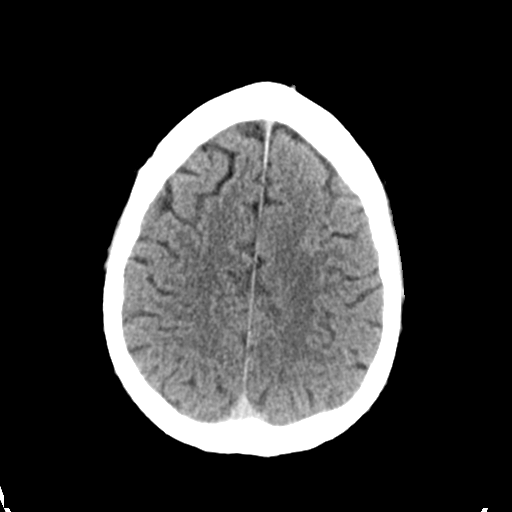
[im 24/32  brain]
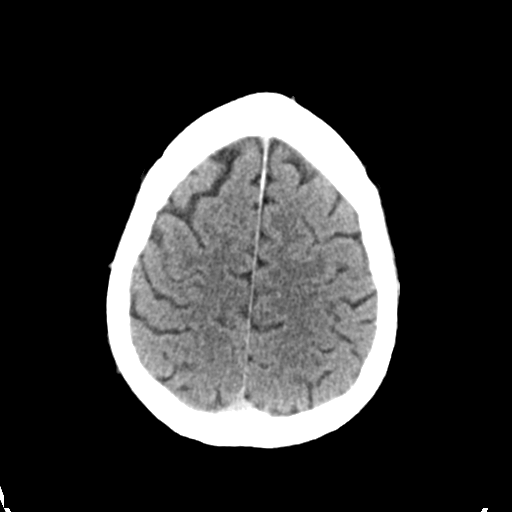
[im 24/32  bone]
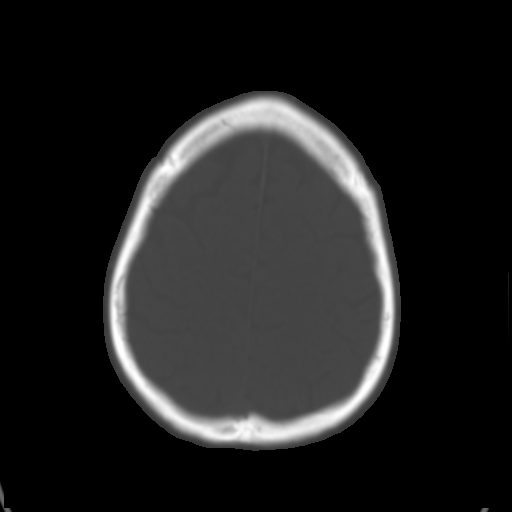
[im 26/32  brain]
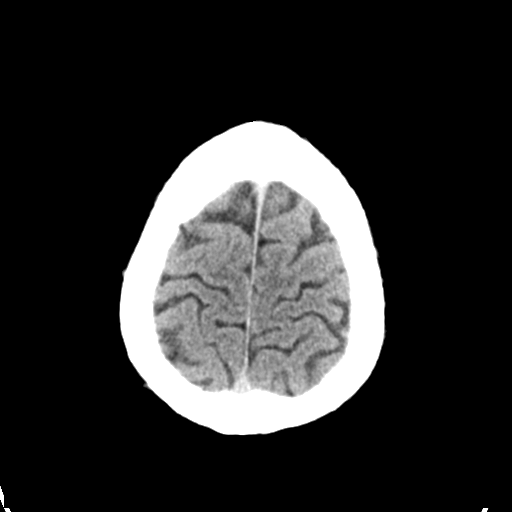
[im 28/32  brain]
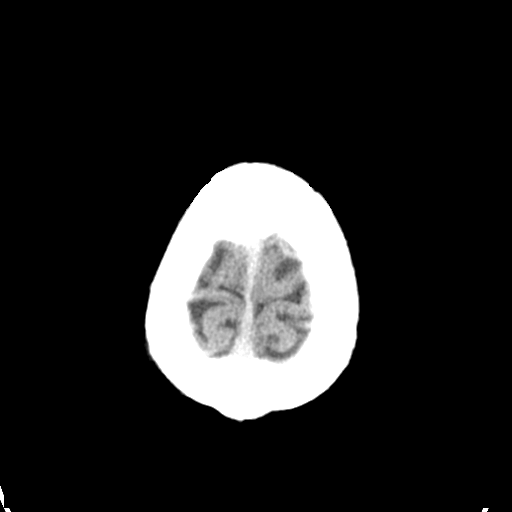
[im 30/32  brain]
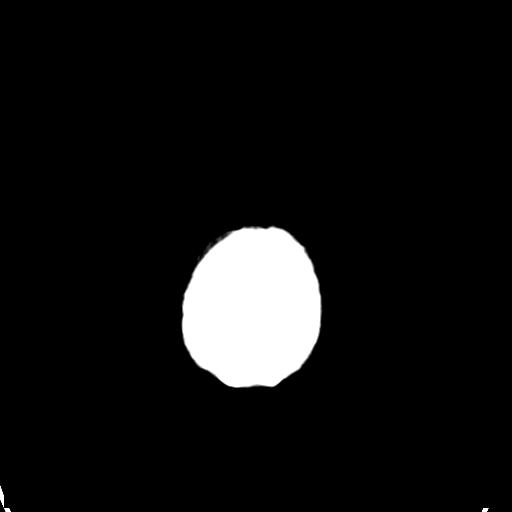

[16 of 30 positions shown; findings below may reference images not displayed]

FINDINGS: There is no evidence of brain mass, brain hemorrhage, or acute
infarction.

The ventricular system is normal size and shape. There is no
evidence of shift of midline structures, parenchymal lesion, or
subdural or epidural hematoma.

The calvarium is intact. Mastoids are well aerated. No sinusitis is
evident. There is slight nasal septal deviation to the right.
IMPRESSION: There is no evidence of brain mass, brain hemorrhage, or acute
infarction.

No acute or active process is seen. No skull lesion is evident. No
sinusitis is evident.

## 2014-10-29 ENCOUNTER — Other Ambulatory Visit: Payer: Self-pay | Admitting: Internal Medicine

## 2014-10-29 NOTE — Telephone Encounter (Signed)
Patient is prn follow up. Ok to refill or defer to pcp? Please advise. Thanks, MI

## 2014-11-20 ENCOUNTER — Other Ambulatory Visit: Payer: Self-pay | Admitting: Internal Medicine

## 2014-11-20 NOTE — Telephone Encounter (Signed)
Please advise on refill as patient has not seen Dr Harrington Challenger in almost two years and was follow up to be determined. Was seen 06/2013 by Ellen Henri and told to follow up prn. Thanks, MI

## 2015-01-01 ENCOUNTER — Other Ambulatory Visit: Payer: Self-pay | Admitting: Internal Medicine

## 2015-01-01 NOTE — Telephone Encounter (Signed)
Yes PCP to refill unless he makes a return appt

## 2015-01-10 ENCOUNTER — Telehealth: Payer: Self-pay | Admitting: Internal Medicine

## 2015-01-10 NOTE — Telephone Encounter (Signed)
Patient is prn follow up with Dr Harrington Challenger. Should this be sent to pcp?

## 2015-01-14 ENCOUNTER — Telehealth: Payer: Self-pay | Admitting: Internal Medicine

## 2015-01-14 MED ORDER — OMEPRAZOLE 40 MG PO CPDR: 40.0000 mg | DELAYED_RELEASE_CAPSULE | Freq: Two times a day (BID) | ORAL | Status: DC

## 2015-01-14 NOTE — Telephone Encounter (Signed)
Sent refill for omeprazole already.  Not sure which med is requested here.

## 2015-01-14 NOTE — Telephone Encounter (Signed)
[facility] pharmacy faxing a request for a refill on Omeprazole 40 mg tablet. Pt has not been seen in our office sent 07/24/2013. Please advise

## 2015-05-19 ENCOUNTER — Ambulatory Visit (INDEPENDENT_AMBULATORY_CARE_PROVIDER_SITE_OTHER): Payer: BLUE CROSS/BLUE SHIELD | Admitting: Family Medicine

## 2015-05-19 VITALS — BP 142/92 | HR 83 | Temp 98.5°F | Ht 72.0 in | Wt 203.0 lb

## 2015-05-19 DIAGNOSIS — J019 Acute sinusitis, unspecified: Secondary | ICD-10-CM | POA: Diagnosis not present

## 2015-05-19 MED ORDER — AMOXICILLIN-POT CLAVULANATE 875-125 MG PO TABS: 1.0000 | ORAL_TABLET | Freq: Two times a day (BID) | ORAL | Status: DC

## 2015-05-19 MED ORDER — HYOSCYAMINE SULFATE 0.125 MG SL SUBL: 0.1250 mg | SUBLINGUAL_TABLET | SUBLINGUAL | Status: DC | PRN

## 2015-05-19 NOTE — Progress Notes (Signed)
   Subjective:    Patient ID: [name] [name], male    DOB: 04-16-1965, 50 y.o.   MRN: [id number]  HPI  Acute sinusitis symptoms. About 3 weeks ago developed some allergic symptoms. He then developed bilateral maxillary facial pain with some yellowish discharge. Increased malaise. Nonproductive cough. Possibly some mild wheezing off and on.  Has tried over-the-counter Mucinex without much relief. Also tried Netty pot. Denies any hemoptysis. No dyspnea.  Past Medical History  Diagnosis Date  . GERD 12/23/2009  . Pneumonia 10/2012  . Shortness of breath     "at rest" (11/22/2012)  . Daily headache   . Internal hemorrhoids   . Serrated adenoma of colon   . GERD (gastroesophageal reflux disease)   . IBS (irritable bowel syndrome)   . PVC (premature ventricular contraction)   . Nonsustained ventricular tachycardia 06/2013   Past Surgical History  Procedure Laterality Date  . Vasectomy  1990's    reports that he has been smoking Cigarettes.  He has a 11.5 pack-year smoking history. He has never used smokeless tobacco. He reports that he drinks about 16.8 oz of alcohol per week. He reports that he does not use illicit drugs. family history includes Colon polyps in his father; Diabetes in his maternal grandmother; Prostate cancer in his paternal grandfather; Stomach cancer in his paternal uncle; Stroke in his paternal grandfather. There is no history of Colon cancer, Esophageal cancer, Rectal cancer, or Pancreatic cancer. No Known Allergies   Review of Systems  Constitutional: Positive for fatigue. Negative for fever and chills.  HENT: Positive for congestion, postnasal drip, sinus pressure, sore throat and voice change.   Respiratory: Positive for cough.        Objective:   Physical Exam  Constitutional: He appears well-developed and well-nourished.  HENT:  Right Ear: External ear normal.  Left Ear: External ear normal.  Slightly erythematous posterior pharynx without exudate    Neck: Neck supple.  Cardiovascular: Normal rate and regular rhythm.   Pulmonary/Chest: Effort normal and breath sounds normal. No respiratory distress. He has no wheezes. He has no rales.  Lymphadenopathy:    He has no cervical adenopathy.          Assessment & Plan:  Probable bilateral acute maxillary sinusitis. Given duration of symptoms treat with Augmentin 875 mg twice daily for 10 days. Continue Mucinex. Follow-up as needed

## 2015-05-19 NOTE — Progress Notes (Signed)
Pre visit review using our clinic review tool, if applicable. No additional management support is needed unless otherwise documented below in the visit note. 

## 2015-05-19 NOTE — Patient Instructions (Signed)

## 2015-09-15 DIAGNOSIS — E291 Testicular hypofunction: Secondary | ICD-10-CM | POA: Diagnosis not present

## 2015-09-19 DIAGNOSIS — E291 Testicular hypofunction: Secondary | ICD-10-CM | POA: Diagnosis not present

## 2015-09-19 DIAGNOSIS — N529 Male erectile dysfunction, unspecified: Secondary | ICD-10-CM | POA: Diagnosis not present

## 2015-10-14 ENCOUNTER — Ambulatory Visit (INDEPENDENT_AMBULATORY_CARE_PROVIDER_SITE_OTHER): Payer: BLUE CROSS/BLUE SHIELD | Admitting: Family Medicine

## 2015-10-14 ENCOUNTER — Encounter: Payer: Self-pay | Admitting: Family Medicine

## 2015-10-14 VITALS — BP 136/90 | HR 76 | Temp 98.3°F | Ht 72.0 in | Wt 205.4 lb

## 2015-10-14 DIAGNOSIS — I10 Essential (primary) hypertension: Secondary | ICD-10-CM

## 2015-10-14 DIAGNOSIS — J069 Acute upper respiratory infection, unspecified: Secondary | ICD-10-CM | POA: Diagnosis not present

## 2015-10-14 DIAGNOSIS — Z720 Tobacco use: Secondary | ICD-10-CM | POA: Diagnosis not present

## 2015-10-14 MED ORDER — BENZONATATE 100 MG PO CAPS: 100.0000 mg | ORAL_CAPSULE | Freq: Two times a day (BID) | ORAL | 0 refills | Status: DC | PRN

## 2015-10-14 MED ORDER — ALBUTEROL SULFATE HFA 108 (90 BASE) MCG/ACT IN AERS: 2.0000 | INHALATION_SPRAY | Freq: Four times a day (QID) | RESPIRATORY_TRACT | 0 refills | Status: DC | PRN

## 2015-10-14 NOTE — Patient Instructions (Signed)
BEFORE YOU LEAVE: -follow up: in 1 month for elevated blood pressure and wheezing  It was nice to meet you today. I hope you feel better soon. I sent the cough medication and inhaler to your pharmacy.  Please quit smoking. Let us know how we can help.  INSTRUCTIONS FOR UPPER RESPIRATORY INFECTION:  -plenty of rest and fluids  -nasal saline wash 2-3 times daily (use prepackaged nasal saline or bottled/distilled water if making your own)   -can use AFRIN nasal spray for drainage and nasal congestion - but do NOT use longer then 3-4 days  -can use tylenol (in no history of liver disease) or ibuprofen (if no history of kidney disease, bowel bleeding or significant heart disease) as directed for aches and sorethroat  -in the winter time, using a humidifier at night is helpful (please follow cleaning instructions)  -if you are taking a cough medication - use only as directed, may also try a teaspoon of honey to coat the throat and throat lozenges. If given a cough medication with codeine or hydrocodone or other narcotic please be advised that this contains a strong and  potentially addicting medication. Please follow instructions carefully, take as little as possible and only use AS NEEDED for severe cough. Discuss potential side effects with your pharmacy. Please do not drive or operate machinery while taking these types of medications. Please do not take other sedating medications, drugs or alcohol while taking this medication without discussing with your doctor.  -for sore throat, salt water gargles can help  -follow up if you have fevers, facial pain, tooth pain, difficulty breathing or are worsening or symptoms persist longer then expected  Upper Respiratory Infection, Adult An upper respiratory infection (URI) is also known as the common cold. It is often caused by a type of germ (virus). Colds are easily spread (contagious). You can pass it to others by kissing, coughing, sneezing, or  drinking out of the same glass. Usually, you get better in 1 to 3  weeks.  However, the cough can last for even longer. HOME CARE   Only take medicine as told by your doctor. Follow instructions provided above.  Drink enough water and fluids to keep your pee (urine) clear or pale yellow.  Get plenty of rest.  Return to work when your temperature is < 100 for 24 hours or as told by your doctor. You may use a face mask and wash your hands to stop your cold from spreading. GET HELP RIGHT AWAY IF:   After the first few days, you feel you are getting worse.  You have questions about your medicine.  You have chills, shortness of breath, or red spit (mucus).  You have pain in the face for more then 1-2 days, especially when you bend forward.  You have a fever, puffy (swollen) neck, pain when you swallow, or white spots in the back of your throat.  You have a bad headache, ear pain, sinus pain, or chest pain.  You have a high-pitched whistling sound when you breathe in and out (wheezing).  You cough up blood.  You have sore muscles or a stiff neck. MAKE SURE YOU:   Understand these instructions.  Will watch your condition.  Will get help right away if you are not doing well or get worse. Document Released: 06/30/2007 Document Revised: 04/05/2011 Document Reviewed: 04/18/2013 [facility] Patient Information 2015 [place], [place]. This information is not intended to replace advice given to you by your health care provider. Make  sure you discuss any questions you have with your health care provider.

## 2015-10-14 NOTE — Progress Notes (Signed)
HPI:   Acute visit for:  Cough and congestion: -Started yesterday -Symptoms include nasal congestion, tickle in throat, cough, occasional wheezing and sore throat -Denies fever, shortness of breath, sinus pain, tick bite, flu exposure, recent travel, history of known lung disease  Tobacco abuse -1.5 packs per day -Reports chronic wheezing in the mornings and at night without the diagnosis of COPD or asthma -Is interested in quitting, but is not ready to commit to this today -May consider medication options for help with quitting  Elevated blood pressure: -Reports is always mildly elevated at office visits -Takes Coreg -Denies chest pain, shortness of breath, swelling  ROS: See pertinent positives and negatives per HPI.  Past Medical History:  Diagnosis Date  . Daily headache   . GERD 12/23/2009  . GERD (gastroesophageal reflux disease)   . IBS (irritable bowel syndrome)   . Internal hemorrhoids   . Nonsustained ventricular tachycardia ([place]) 06/2013  . Pneumonia 10/2012  . PVC (premature ventricular contraction)   . Serrated adenoma of colon   . Shortness of breath    "at rest" (11/22/2012)    Past Surgical History:  Procedure Laterality Date  . VASECTOMY  1990's    Family History  Problem Relation Age of Onset  . Prostate cancer Paternal Grandfather   . Stroke Paternal Grandfather   . Stomach cancer Paternal Uncle   . Diabetes Maternal Grandmother   . Colon polyps Father   . Colon cancer Neg Hx   . Esophageal cancer Neg Hx   . Rectal cancer Neg Hx   . Pancreatic cancer Neg Hx     Social History   Social History  . Marital status: Married    Spouse name: N/A  . Number of children: 3  . Years of education: N/A   Occupational History  . financial advisor Engineer, [employer]  . OWNER Engineer, [employer]   Social History Main Topics  . Smoking status: Current Some Day Smoker    Packs/day: 0.50    Years: 23.00    Types: Cigarettes  . Smokeless  tobacco: Never Used  . Alcohol use 16.8 oz/week    28 Shots of liquor per week     Comment: 11/21/2012 "couple shots in each drink; 2 mixed drinks/night"  . Drug use: No  . Sexual activity: Yes   Other Topics Concern  . None   Social History Narrative   Patient does regular exercise.           Current Outpatient Prescriptions:  .  ANDROGEL PUMP 20.25 MG/ACT (1.62%) GEL, Apply 1 application topically daily., Disp: , Rfl:  .  carvedilol (COREG) 6.25 MG tablet, TAKE 2 TABLETS BY MOUTH EVERY MORNING AND 1 TABLET BY MOUTH EVERY EVENING, Disp: 90 tablet, Rfl: 6 .  hyoscyamine (LEVSIN SL) 0.125 MG SL tablet, Take 1 tablet (0.125 mg total) by mouth every 4 (four) hours as needed., Disp: 30 tablet, Rfl: 1 .  omeprazole (PRILOSEC) 40 MG capsule, Take 1 capsule (40 mg total) by mouth 2 (two) times daily., Disp: 60 capsule, Rfl: 6 .  VIAGRA 100 MG tablet, , Disp: , Rfl:  .  albuterol (PROVENTIL HFA;VENTOLIN HFA) 108 (90 Base) MCG/ACT inhaler, Inhale 2 puffs into the lungs every 6 (six) hours as needed., Disp: 1 Inhaler, Rfl: 0 .  benzonatate (TESSALON) 100 MG capsule, Take 1 capsule (100 mg total) by mouth 2 (two) times daily as needed for cough., Disp: 20 capsule, Rfl: 0  EXAM:  Vitals:   10/14/15  1610  BP: 136/90  Pulse: 76  Temp: 98.3 F (36.8 C)    Body mass index is 27.86 kg/m.  GENERAL: vitals reviewed and listed above, alert, oriented, appears well hydrated and in no acute distress  HEENT: atraumatic, conjunttiva clear, no obvious abnormalities on inspection of external nose and ears, normal appearance of ear canals and TMs, clear nasal congestion, mild post oropharyngeal erythema with PND, no tonsillar edema or exudate, no sinus TTP but  NECK: no obvious masses on inspection  LUNGS: clear to auscultation bilaterally, no wheezes, rales or rhonchi, good air movement  CV: HRRR, no peripheral edema  MS: moves all extremities without noticeable abnormality  PSYCH: pleasant  and cooperative, no obvious depression or anxiety  ASSESSMENT AND PLAN:  Discussed the following assessment and plan:  Acute upper respiratory infection -Likely viral, worsened by tobacco use -Discussed symptomatic and supportive measures -Possible bronchial involvement and possible undiagnosed underlying COPD/asthma -Trial albuterol as needed for wheezing and follow-up with PCP -Return precautions  Tobacco abuse -Advised to quit -Offered help -Counseled for about 5 minutes -He is not ready now, but agrees to follow up with PCP about this and consider nicotine, Wellbutrin or Chantix to assist  Essential hypertension -Better on recheck, but still elevated -He did take cough and cold medication today -He opted to recheck with PCP, advised follow-up in 1 month for recheck  -Patient advised to return or notify a doctor immediately if symptoms worsen or persist or new concerns arise.  Patient Instructions  BEFORE YOU LEAVE: -follow up: in 1 month for elevated blood pressure and wheezing  It was nice to meet you today. I hope you feel better soon. I sent the cough medication and inhaler to your pharmacy.  Please quit smoking. Let us know how we can help.  INSTRUCTIONS FOR UPPER RESPIRATORY INFECTION:  -plenty of rest and fluids  -nasal saline wash 2-3 times daily (use prepackaged nasal saline or bottled/distilled water if making your own)   -can use AFRIN nasal spray for drainage and nasal congestion - but do NOT use longer then 3-4 days  -can use tylenol (in no history of liver disease) or ibuprofen (if no history of kidney disease, bowel bleeding or significant heart disease) as directed for aches and sorethroat  -in the winter time, using a humidifier at night is helpful (please follow cleaning instructions)  -if you are taking a cough medication - use only as directed, may also try a teaspoon of honey to coat the throat and throat lozenges. If given a cough medication with  codeine or hydrocodone or other narcotic please be advised that this contains a strong and  potentially addicting medication. Please follow instructions carefully, take as little as possible and only use AS NEEDED for severe cough. Discuss potential side effects with your pharmacy. Please do not drive or operate machinery while taking these types of medications. Please do not take other sedating medications, drugs or alcohol while taking this medication without discussing with your doctor.  -for sore throat, salt water gargles can help  -follow up if you have fevers, facial pain, tooth pain, difficulty breathing or are worsening or symptoms persist longer then expected  Upper Respiratory Infection, Adult An upper respiratory infection (URI) is also known as the common cold. It is often caused by a type of germ (virus). Colds are easily spread (contagious). You can pass it to others by kissing, coughing, sneezing, or drinking out of the same glass. Usually, you get better in  1 to 3  weeks.  However, the cough can last for even longer. HOME CARE   Only take medicine as told by your doctor. Follow instructions provided above.  Drink enough water and fluids to keep your pee (urine) clear or pale yellow.  Get plenty of rest.  Return to work when your temperature is < 100 for 24 hours or as told by your doctor. You may use a face mask and wash your hands to stop your cold from spreading. GET HELP RIGHT AWAY IF:   After the first few days, you feel you are getting worse.  You have questions about your medicine.  You have chills, shortness of breath, or red spit (mucus).  You have pain in the face for more then 1-2 days, especially when you bend forward.  You have a fever, puffy (swollen) neck, pain when you swallow, or white spots in the back of your throat.  You have a bad headache, ear pain, sinus pain, or chest pain.  You have a high-pitched whistling sound when you breathe in and out  (wheezing).  You cough up blood.  You have sore muscles or a stiff neck. MAKE SURE YOU:   Understand these instructions.  Will watch your condition.  Will get help right away if you are not doing well or get worse. Document Released: 06/30/2007 Document Revised: 04/05/2011 Document Reviewed: 04/18/2013 [facility] Patient Information 2015 [place], [place]. This information is not intended to replace advice given to you by your health care provider. Make sure you discuss any questions you have with your health care provider.      Colin Benton R., DO

## 2015-10-14 NOTE — Progress Notes (Signed)
Pre visit review using our clinic review tool, if applicable. No additional management support is needed unless otherwise documented below in the visit note. 

## 2015-10-19 DIAGNOSIS — J40 Bronchitis, not specified as acute or chronic: Secondary | ICD-10-CM | POA: Diagnosis not present

## 2015-11-02 ENCOUNTER — Other Ambulatory Visit: Payer: Self-pay | Admitting: Internal Medicine

## 2015-11-03 NOTE — Telephone Encounter (Signed)
Should this be deferred to patients pcp? Please advise. Thanks, MI 

## 2015-11-03 NOTE — Telephone Encounter (Signed)
Patient is as needed follow up with Dr. Harrington Challenger, please refill x 90 days and please refer to PCP for future medication refills.

## 2015-11-05 ENCOUNTER — Other Ambulatory Visit: Payer: Self-pay | Admitting: Family Medicine

## 2015-11-10 NOTE — Telephone Encounter (Signed)
Refill OK

## 2015-12-28 ENCOUNTER — Other Ambulatory Visit: Payer: Self-pay | Admitting: Internal Medicine

## 2015-12-28 ENCOUNTER — Other Ambulatory Visit: Payer: Self-pay | Admitting: Family Medicine

## 2016-01-01 ENCOUNTER — Telehealth: Payer: Self-pay | Admitting: Family Medicine

## 2016-01-01 NOTE — Telephone Encounter (Signed)
Pt request refill  omeprazole (PRILOSEC) 40 MG capsule  90 day [facility]/ [place]

## 2016-01-05 ENCOUNTER — Ambulatory Visit (INDEPENDENT_AMBULATORY_CARE_PROVIDER_SITE_OTHER): Payer: BLUE CROSS/BLUE SHIELD | Admitting: Family Medicine

## 2016-01-05 ENCOUNTER — Encounter: Payer: Self-pay | Admitting: Family Medicine

## 2016-01-05 VITALS — BP 142/98 | HR 79 | Temp 98.2°F | Ht 72.0 in | Wt 206.8 lb

## 2016-01-05 DIAGNOSIS — I471 Supraventricular tachycardia: Secondary | ICD-10-CM | POA: Diagnosis not present

## 2016-01-05 DIAGNOSIS — Z23 Encounter for immunization: Secondary | ICD-10-CM

## 2016-01-05 DIAGNOSIS — R030 Elevated blood-pressure reading, without diagnosis of hypertension: Secondary | ICD-10-CM

## 2016-01-05 DIAGNOSIS — K219 Gastro-esophageal reflux disease without esophagitis: Secondary | ICD-10-CM | POA: Diagnosis not present

## 2016-01-05 MED ORDER — OMEPRAZOLE 40 MG PO CPDR: 40.0000 mg | DELAYED_RELEASE_CAPSULE | Freq: Every day | ORAL | 3 refills | Status: DC

## 2016-01-05 NOTE — Patient Instructions (Signed)
Limit Vodka to no more than 3 ounces per day Establish more consistent exercise Keep sodium intake less than 3 grams per day Monitor blood pressure and be in touch if consistently > 140/90

## 2016-01-05 NOTE — Progress Notes (Signed)
Subjective:     Patient ID: [name] [name], male   DOB: Nov 18, 1965, 50 y.o.   MRN: [id number]  HPI Patient seen for follow-up for medical issues. He has history of GERD currently stable on omeprazole once daily. No dysphagia.  He has history of PSVT and was started by cardiology on carvedilol 6.25 mgs 2 in the morning and 1 in the evening. However, recently he has only been taking 2 in the morning and not taking the nighttime dose. He has not had any breakthrough tachycardia episodes. He has hypogonadism treated by urology currently with Axiron. They are monitoring his PSA and CBC.  Patient has history of borderline elevated blood pressure but never treated for hypertension. No headaches. No dizziness. No recent exercise. He frequently drinks vodka about 6 ounces per night. No flu vaccine yet.  Past Medical History:  Diagnosis Date  . Daily headache   . GERD 12/23/2009  . GERD (gastroesophageal reflux disease)   . IBS (irritable bowel syndrome)   . Internal hemorrhoids   . Nonsustained ventricular tachycardia ([place]) 06/2013  . Pneumonia 10/2012  . PVC (premature ventricular contraction)   . Serrated adenoma of colon   . Shortness of breath    "at rest" (11/22/2012)   Past Surgical History:  Procedure Laterality Date  . VASECTOMY  1990's    reports that he has been smoking Cigarettes.  He has a 11.50 pack-year smoking history. He has never used smokeless tobacco. He reports that he drinks about 16.8 oz of alcohol per week . He reports that he does not use drugs. family history includes Colon polyps in his father; Diabetes in his maternal grandmother; Prostate cancer in his paternal grandfather; Stomach cancer in his paternal uncle; Stroke in his paternal grandfather. No Known Allergies   Review of Systems  Constitutional: Negative for fatigue and unexpected weight change.  Eyes: Negative for visual disturbance.  Respiratory: Negative for cough, chest tightness and shortness of breath.    Cardiovascular: Negative for chest pain, palpitations and leg swelling.  Endocrine: Negative for polydipsia and polyuria.  Neurological: Negative for dizziness, syncope, weakness, light-headedness and headaches.       Objective:   Physical Exam  Constitutional: He is oriented to person, place, and time. He appears well-developed and well-nourished.  HENT:  Right Ear: External ear normal.  Left Ear: External ear normal.  Mouth/Throat: Oropharynx is clear and moist.  Eyes: Pupils are equal, round, and reactive to light.  Neck: Neck supple. No thyromegaly present.  Cardiovascular: Normal rate and regular rhythm.   Pulmonary/Chest: Effort normal and breath sounds normal. No respiratory distress. He has no wheezes. He has no rales.  Musculoskeletal: He exhibits no edema.  Neurological: He is alert and oriented to person, place, and time.       Assessment:     #1 GERD. Stable on omeprazole  #2 elevated blood pressure with no prior diagnosis hypertension  #3 history of PSVT stable symptomatically    Plan:     -increase his Coreg back to twice a day-as prescribed -Reduce alcohol intake -Monitor blood pressure at home and be in touch with consistently greater than 140/90 -Keep sodium intake less than 3 g per day -Establish more consistent aerobic exercise -Flu vaccine given  Eulas Post MD [facility]

## 2016-01-05 NOTE — Progress Notes (Signed)
Pre visit review using our clinic review tool, if applicable. No additional management support is needed unless otherwise documented below in the visit note. 

## 2016-01-05 NOTE — Telephone Encounter (Signed)
Medication sent in for patient. 

## 2016-03-03 DIAGNOSIS — E291 Testicular hypofunction: Secondary | ICD-10-CM | POA: Diagnosis not present

## 2016-03-03 DIAGNOSIS — Z125 Encounter for screening for malignant neoplasm of prostate: Secondary | ICD-10-CM | POA: Diagnosis not present

## 2016-03-08 DIAGNOSIS — N529 Male erectile dysfunction, unspecified: Secondary | ICD-10-CM | POA: Diagnosis not present

## 2016-03-08 DIAGNOSIS — E291 Testicular hypofunction: Secondary | ICD-10-CM | POA: Diagnosis not present

## 2016-03-23 ENCOUNTER — Other Ambulatory Visit: Payer: Self-pay | Admitting: Internal Medicine

## 2016-03-23 ENCOUNTER — Other Ambulatory Visit: Payer: Self-pay | Admitting: Family Medicine

## 2016-03-23 NOTE — Telephone Encounter (Signed)
Per last refill  PHYNIX MATEY  11/02/2015  Refill  MRN:  [id number]  Description: 51 year old male Provider: Fay Records, MD Department: [address] [facility]  Call Documentation   Rodman Key, RN at 11/03/2015 3:44 PM   Status: Signed    Patient is as needed follow up with Dr. Harrington Challenger, please refill x 90 days and please refer to PCP for future medication refills.    Juventino Slovak, CMA at 11/03/2015 2:49 PM   Status: Signed    Should this be deferred to patients pcp? Please advise. Thanks, MI    Encounter MyChart Messages   No messages in this encounter  Approved    Disp Refills Start End  carvedilol (COREG) 6.25 MG tablet 270 tablet 0 11/03/2015   Sig:  TAKE 2 TABLETS BY MOUTH EVERY MORNING AND 1 TABLET BY MOUTH EVERY EVENING  Class:  Normal  DAW:  No  Comment:  **Please forward future refill requests to patients pcp**  Authorizing Provider:  Fay Records, MD  Ordering User:  Juventino Slovak, CMA  Visit Pharmacy   [id number] - [place], [place] - [address]

## 2016-04-15 DIAGNOSIS — B078 Other viral warts: Secondary | ICD-10-CM | POA: Diagnosis not present

## 2016-08-24 ENCOUNTER — Ambulatory Visit (INDEPENDENT_AMBULATORY_CARE_PROVIDER_SITE_OTHER): Payer: BLUE CROSS/BLUE SHIELD | Admitting: Gastroenterology

## 2016-08-24 ENCOUNTER — Encounter: Payer: Self-pay | Admitting: Gastroenterology

## 2016-08-24 VITALS — BP 148/106 | HR 64 | Ht 72.0 in | Wt 209.5 lb

## 2016-08-24 DIAGNOSIS — Z86010 Personal history of colonic polyps: Secondary | ICD-10-CM

## 2016-08-24 DIAGNOSIS — R1032 Left lower quadrant pain: Secondary | ICD-10-CM

## 2016-08-24 MED ORDER — HYOSCYAMINE SULFATE 0.125 MG SL SUBL: 0.1250 mg | SUBLINGUAL_TABLET | SUBLINGUAL | 1 refills | Status: DC | PRN

## 2016-08-24 NOTE — Progress Notes (Signed)
[facility] Consult Note:  History: [name] [name] 08/24/2016  Referring physician: Self-referred  Reason for consult/chief complaint: Abdominal Pain (LLQ and bloating - chronic in nature. Pt has 3-4 bowel movements per day.  Here to discuss if time for recall colon.)   Subjective  HPI:  This is a 51 year old man new to me, last seen by Dr. Olevia Perches in June 2015. At that point he had chronic left lower quadrant pain that was felt likely related to IBS and underlying stress. He had had a sessile polyp of the ileocecal valve in 2012, and on repeat colonoscopy in September 2015 found to have a carpet like polyp on the IC valve. Dr. Olevia Perches recommended surgical resection on the colonoscopy report, but seems to have no longer recommended that after pathology showed that polyp to be hyperplastic. He had additional adenomatous polyps on the same colonoscopy, and reportedly had a total of 5 total polyps in 2015. There was also an ongoing matter of palpitations with nonsustained V. tach managed by Dr. Lovena Le, in this matter seems to have resolved since he is no longer following with cardiology. [name] [name] is having ongoing issues with the intermittent crampy left lower quadrant pain. It is brief in a tightness or fullness. His stools are also somewhat more frequent but not loose. He denies rectal bleeding. He was mostly concerned because he got conflicting recommendations a few years ago on whether or not his next colonoscopy should be every 3 or 5 year interval.  ROS:  Review of Systems  Constitutional: Negative for appetite change and unexpected weight change.  HENT: Negative for mouth sores and voice change.   Eyes: Negative for pain and redness.  Respiratory: Negative for cough and shortness of breath.   Cardiovascular: Negative for chest pain and palpitations.  Genitourinary: Negative for dysuria and hematuria.  Musculoskeletal: Negative for arthralgias and myalgias.  Skin:  Negative for pallor and rash.  Neurological: Negative for weakness and headaches.  Hematological: Negative for adenopathy.     Past Medical History: Past Medical History:  Diagnosis Date  . Daily headache   . GERD 12/23/2009  . GERD (gastroesophageal reflux disease)   . IBS (irritable bowel syndrome)   . Internal hemorrhoids   . Nonsustained ventricular tachycardia ([place]) 06/2013  . Pneumonia 10/2012  . PVC (premature ventricular contraction)   . Serrated adenoma of colon   . Shortness of breath    "at rest" (11/22/2012)     Past Surgical History: Past Surgical History:  Procedure Laterality Date  . VASECTOMY  1990's     Family History: Family History  Problem Relation Age of Onset  . Colon polyps Father   . Prostate cancer Paternal Grandfather   . Stroke Paternal Grandfather   . Stomach cancer Paternal Uncle   . Diabetes Maternal Grandmother   . Colon cancer Neg Hx   . Esophageal cancer Neg Hx   . Rectal cancer Neg Hx   . Pancreatic cancer Neg Hx     Social History: Social History   Social History  . Marital status: Married    Spouse name: N/A  . Number of children: 3  . Years of education: N/A   Occupational History  . financial advisor Engineer, [employer]  . OWNER Engineer, [employer]   Social History Main Topics  . Smoking status: Current Some Day Smoker    Packs/day: 0.50    Years: 23.00    Types: Cigarettes  . Smokeless tobacco: Never Used  .  Alcohol use 16.8 oz/week    28 Shots of liquor per week     Comment: 11/21/2012 "couple shots in each drink; 2 mixed drinks/night"  . Drug use: No  . Sexual activity: Yes   Other Topics Concern  . None   Social History Narrative   Patient does regular exercise.          Allergies: No Known Allergies  Outpatient Meds: Current Outpatient Prescriptions  Medication Sig Dispense Refill  . carvedilol (COREG) 6.25 MG tablet TAKE 2 TABLETS BY MOUTH EVERY MORNING AND 1 TABLET EVERY EVENING 270  tablet 1  . omeprazole (PRILOSEC) 40 MG capsule Take 1 capsule (40 mg total) by mouth daily. 90 capsule 3  . PROVENTIL HFA 108 (90 Base) MCG/ACT inhaler INHALE 2 PUFFS INTO THE LUNGS EVERY 6 HOURS AS NEEDED 6.7 g 5  . Testosterone (AXIRON) 30 MG/ACT SOLN Place 1 application onto the skin daily.    Marland Kitchen VIAGRA 100 MG tablet     . hyoscyamine (LEVSIN SL) 0.125 MG SL tablet Take 1 tablet (0.125 mg total) by mouth every 4 (four) hours as needed. 30 tablet 1   No current facility-administered medications for this visit.       ___________________________________________________________________ Objective   Exam:  BP (!) 148/106   Pulse 64   Ht 6' (1.829 m)   Wt 209 lb 8 oz (95 kg)   BMI 28.41 kg/m    General: this is a(n) Well-appearing middle-aged man   Eyes: sclera anicteric, no redness  ENT: oral mucosa moist without lesions, no cervical or supraclavicular lymphadenopathy, good dentition  CV: RRR without murmur, S1/S2, no JVD, no peripheral edema  Resp: clear to auscultation bilaterally, normal RR and effort noted  GI: soft, no tenderness, with active bowel sounds. No guarding or palpable organomegaly noted.  Skin; warm and dry, no rash or jaundice noted  Neuro: awake, alert and oriented x 3. Normal gross motor function and fluent speech  Labs: Colonoscopy and pathology reports from 2012 and 2015 were reviewed and reports were shown to the patient.   Assessment: Encounter Diagnoses  Name Primary?  Marland Kitchen LLQ abdominal pain Yes  . History of colon polyps     I agree with Dr. Olevia Perches that he does seem to have IBS-like pain. I'm unclear on the pathology of his polyps. I am concerned that an ileocecal valve polyp in 2012 was reportedly a sessile serrated adenoma, but a larger polyp in the same spot 3 years later was reported to be hyperplastic. I wonder if that could have been a sessile serrated polyp.  Plan:  He is not certain whether hyoscyamine helped him in the past, but is  willing to give it another try so I sent a prescription. He is scheduled for a colonoscopy for history of colon polyps.  The benefits and risks of the planned procedure were described in detail with the patient or (when appropriate) their health care proxy.  Risks were outlined as including, but not limited to, bleeding, infection, perforation, adverse medication reaction leading to cardiac or pulmonary decompensation, or pancreatitis (if ERCP).  The limitation of incomplete mucosal visualization was also discussed.  No guarantees or warranties were given.   Thank you for the courtesy of this consult.  Please call me with any questions or concerns.  Nelida Meuse III  CC: Eulas Post, MD

## 2016-08-24 NOTE — Patient Instructions (Signed)
If you are age 52 or older, your body mass index should be between 23-30. Your Body mass index is 28.41 kg/m. If this is out of the aforementioned range listed, please consider follow up with your Primary Care Provider.  If you are age 64 or younger, your body mass index should be between 19-25. Your Body mass index is 28.41 kg/m. If this is out of the aformentioned range listed, please consider follow up with your Primary Care Provider.   Thank you for choosing [facility]  Dr Wilfrid Lund III

## 2016-08-26 DIAGNOSIS — E291 Testicular hypofunction: Secondary | ICD-10-CM | POA: Diagnosis not present

## 2016-09-02 DIAGNOSIS — E291 Testicular hypofunction: Secondary | ICD-10-CM | POA: Diagnosis not present

## 2016-09-02 DIAGNOSIS — Z125 Encounter for screening for malignant neoplasm of prostate: Secondary | ICD-10-CM | POA: Diagnosis not present

## 2016-09-02 DIAGNOSIS — N529 Male erectile dysfunction, unspecified: Secondary | ICD-10-CM | POA: Diagnosis not present

## 2016-09-07 ENCOUNTER — Telehealth: Payer: Self-pay

## 2016-09-07 NOTE — Telephone Encounter (Signed)
Left a voicemail to schedule a colonoscopy. Pt was seen in clinic on 08-24-2016. At the time of the visit, he couldn't schedule a colonoscopy without checking his schedule.

## 2016-09-20 ENCOUNTER — Encounter: Payer: Self-pay | Admitting: Gastroenterology

## 2016-09-20 ENCOUNTER — Other Ambulatory Visit: Payer: Self-pay | Admitting: Family Medicine

## 2016-10-14 ENCOUNTER — Encounter: Payer: Self-pay | Admitting: Family Medicine

## 2016-10-26 ENCOUNTER — Ambulatory Visit (AMBULATORY_SURGERY_CENTER): Payer: Self-pay

## 2016-10-26 VITALS — Ht 72.0 in | Wt 204.6 lb

## 2016-10-26 DIAGNOSIS — Z86010 Personal history of colonic polyps: Secondary | ICD-10-CM

## 2016-10-26 DIAGNOSIS — Z8371 Family history of colonic polyps: Secondary | ICD-10-CM

## 2016-10-26 MED ORDER — NA SULFATE-K SULFATE-MG SULF 17.5-3.13-1.6 GM/177ML PO SOLN: ORAL | 0 refills | Status: DC

## 2016-10-26 NOTE — Progress Notes (Signed)
Per pt, no allergies to soy or egg products.Pt not taking any weight loss meds or using  O2 at home.   Pt refused Emmi video. 

## 2016-10-27 ENCOUNTER — Encounter: Payer: Self-pay | Admitting: Gastroenterology

## 2016-11-08 ENCOUNTER — Telehealth: Payer: Self-pay | Admitting: Gastroenterology

## 2016-11-09 ENCOUNTER — Encounter: Payer: BLUE CROSS/BLUE SHIELD | Admitting: Gastroenterology

## 2016-12-14 ENCOUNTER — Other Ambulatory Visit: Payer: Self-pay | Admitting: Family Medicine

## 2017-02-10 ENCOUNTER — Encounter: Payer: Self-pay | Admitting: Gastroenterology

## 2017-02-10 ENCOUNTER — Ambulatory Visit (AMBULATORY_SURGERY_CENTER): Payer: BLUE CROSS/BLUE SHIELD

## 2017-02-10 VITALS — Ht 72.0 in | Wt 204.2 lb

## 2017-02-10 DIAGNOSIS — Z86010 Personal history of colonic polyps: Secondary | ICD-10-CM

## 2017-02-10 MED ORDER — NA SULFATE-K SULFATE-MG SULF 17.5-3.13-1.6 GM/177ML PO SOLN: 1.0000 | Freq: Once | ORAL | 0 refills | Status: AC

## 2017-02-10 NOTE — Progress Notes (Signed)
Per pt, no allergies to soy or egg products.Pt not taking any weight loss meds or using  O2 at home.  Pt refused emmi video. 

## 2017-02-21 ENCOUNTER — Ambulatory Visit (AMBULATORY_SURGERY_CENTER): Payer: BLUE CROSS/BLUE SHIELD | Admitting: Gastroenterology

## 2017-02-21 ENCOUNTER — Encounter: Payer: Self-pay | Admitting: Gastroenterology

## 2017-02-21 ENCOUNTER — Other Ambulatory Visit: Payer: Self-pay

## 2017-02-21 VITALS — BP 130/91 | HR 71 | Temp 98.0°F | Resp 17 | Ht 72.0 in | Wt 204.0 lb

## 2017-02-21 DIAGNOSIS — D128 Benign neoplasm of rectum: Secondary | ICD-10-CM | POA: Diagnosis not present

## 2017-02-21 DIAGNOSIS — K635 Polyp of colon: Secondary | ICD-10-CM | POA: Diagnosis not present

## 2017-02-21 DIAGNOSIS — D129 Benign neoplasm of anus and anal canal: Secondary | ICD-10-CM

## 2017-02-21 DIAGNOSIS — D120 Benign neoplasm of cecum: Secondary | ICD-10-CM

## 2017-02-21 DIAGNOSIS — D123 Benign neoplasm of transverse colon: Secondary | ICD-10-CM | POA: Diagnosis not present

## 2017-02-21 DIAGNOSIS — Z86010 Personal history of colonic polyps: Secondary | ICD-10-CM | POA: Diagnosis not present

## 2017-02-21 HISTORY — PX: COLONOSCOPY W/ BIOPSIES AND POLYPECTOMY: SHX1376

## 2017-02-21 MED ORDER — SODIUM CHLORIDE 0.9 % IV SOLN: 500.0000 mL | Freq: Once | INTRAVENOUS | Status: DC

## 2017-02-21 NOTE — Progress Notes (Signed)
Called to room to assist during endoscopic procedure.  Patient ID and intended procedure confirmed with present staff. Received instructions for my participation in the procedure from the performing physician.  

## 2017-02-21 NOTE — Patient Instructions (Addendum)
YOU HAD AN ENDOSCOPIC PROCEDURE TODAY AT [facility]:   Refer to the procedure report that was given to you for any specific questions about what was found during the examination.  If the procedure report does not answer your questions, please call your gastroenterologist to clarify.  If you requested that your care partner not be given the details of your procedure findings, then the procedure report has been included in a sealed envelope for you to review at your convenience later.  YOU SHOULD EXPECT: Some feelings of bloating in the abdomen. Passage of more gas than usual.  Walking can help get rid of the air that was put into your GI tract during the procedure and reduce the bloating. If you had a lower endoscopy (such as a colonoscopy or flexible sigmoidoscopy) you may notice spotting of blood in your stool or on the toilet paper. If you underwent a bowel prep for your procedure, you may not have a normal bowel movement for a few days.  Please Note:  You might notice some irritation and congestion in your nose or some drainage.  This is from the oxygen used during your procedure.  There is no need for concern and it should clear up in a day or so.  SYMPTOMS TO REPORT IMMEDIATELY:   Following lower endoscopy (colonoscopy or flexible sigmoidoscopy):  Excessive amounts of blood in the stool  Significant tenderness or worsening of abdominal pains  Swelling of the abdomen that is new, acute  Fever of 100F or higher   For urgent or emergent issues, a gastroenterologist can be reached at any hour by calling [phone].   DIET:  We do recommend a small meal at first, but then you may proceed to your regular diet.  Drink plenty of fluids but you should avoid alcoholic beverages for 24 hours.  ACTIVITY:  You should plan to take it easy for the rest of today and you should NOT DRIVE or use heavy machinery until tomorrow (because of the sedation medicines used during the test).     FOLLOW UP: Our staff will call the number listed on your records the next business day following your procedure to check on you and address any questions or concerns that you may have regarding the information given to you following your procedure. If we do not reach you, we will leave a message.  However, if you are feeling well and you are not experiencing any problems, there is no need to return our call.  We will assume that you have returned to your regular daily activities without incident.  If any biopsies were taken you will be contacted by phone or by letter within the next 1-3 weeks.  Please call us at [phone] if you have not heard about the biopsies in 3 weeks.    SIGNATURES/CONFIDENTIALITY: You and/or your care partner have signed paperwork which will be entered into your electronic medical record.  These signatures attest to the fact that that the information above on your After Visit Summary has been reviewed and is understood.  Full responsibility of the confidentiality of this discharge information lies with you and/or your care-partner.    Handouts were given to your care partner on polyps, diverticulosis, and hemorrhoids. You may resume your current medications today. Await biopsy results. Please call if any questions or concerns. No aspirin,ibuprofen,naproxen, or other non-steroidal inflammatory drugs for 5 days.

## 2017-02-21 NOTE — Progress Notes (Signed)
To recovery, report to RN, VSS. 

## 2017-02-21 NOTE — Op Note (Signed)
[facility] Patient Name: [name] [name] Procedure Date: 02/21/2017 11:19 AM MRN: [id number] Endoscopist: Mallie Mussel L. Loletha Carrow , MD Age: 52 Referring MD:  Date of Birth: 07-26-1965 Gender: Male Account #: [id number] Procedure:                Colonoscopy Indications:              High risk colon cancer surveillance: Personal                            history of sessile serrated colon polyp (10 mm or                            greater in size). Large carpet-like polyp on ICV                            2012 (biopsy = SSP), still present in 2015 (biopsy                            = hyperplastic).                           Adenomatous asc colon and rectal polyps in 2015 as                            well Medicines:                Monitored Anesthesia Care Procedure:                Pre-Anesthesia Assessment:                           - Prior to the procedure, a History and Physical                            was performed, and patient medications and                            allergies were reviewed. The patient's tolerance of                            previous anesthesia was also reviewed. The risks                            and benefits of the procedure and the sedation                            options and risks were discussed with the patient.                            All questions were answered, and informed consent                            was obtained. Prior Anticoagulants: The patient has  taken no previous anticoagulant or antiplatelet                            agents. ASA Grade Assessment: II - A patient with                            mild systemic disease. After reviewing the risks                            and benefits, the patient was deemed in                            satisfactory condition to undergo the procedure.                           After obtaining informed consent, the colonoscope                            was passed under  direct vision. Throughout the                            procedure, the patient's blood pressure, pulse, and                            oxygen saturations were monitored continuously. The                            Colonoscope was introduced through the anus and                            advanced to the the cecum, identified by                            appendiceal orifice and ileocecal valve. The                            colonoscopy was performed without difficulty. The                            patient tolerated the procedure well. The quality                            of the bowel preparation was excellent. The                            ileocecal valve, appendiceal orifice, and rectum                            were photographed. Scope In: 11:29:17 AM Scope Out: 11:52:10 AM Scope Withdrawal Time: 0 hours 18 minutes 10 seconds  Total Procedure Duration: 0 hours 22 minutes 53 seconds  Findings:                 The perianal and digital rectal examinations were  normal.                           A 8 mm polyp was found adjacent to the appendiceal                            orifice (half-circumferential). The polyp was                            multi-lobulated. The polyp was not removed (see                            below)                           A large polyp was found in the ileocecal valve. The                            polyp was carpet-like and multi-lobulated, wrapping                            over to the proximal side of the ICV (probable SSP                            by WL and NBI appearance). Six biopsies were taken                            with a cold forceps for histology.                           A 4 mm polyp was found in the hepatic flexure. The                            polyp was sessile. The polyp was removed with a                            piecemeal technique using a cold biopsy forceps.                            Resection and  retrieval were complete.                           A 10 mm polyp was found in the transverse colon.                            The polyp was semi-pedunculated (probable SSP by WL                            and NBI appearance). The polyp was removed with a                            hot snare. Resection and retrieval were complete.  A 6 mm polyp was found in the rectum. The polyp was                            sessile. The polyp was removed with a cold snare.                            Resection and retrieval were complete.                           A few diverticula were found in the right colon.                           Internal hemorrhoids were found. The hemorrhoids                            were Grade I (internal hemorrhoids that do not                            prolapse).                           The exam was otherwise without abnormality on                            direct and retroflexion views. Complications:            No immediate complications. Estimated Blood Loss:     Estimated blood loss was minimal. Impression:               - One 8 mm polyp at the appendiceal orifice.                           - One large polyp at the ileocecal valve. Biopsied.                           - One 4 mm polyp at the hepatic flexure, removed                            piecemeal using a cold biopsy forceps. Resected and                            retrieved.                           - One 10 mm polyp in the transverse colon, removed                            with a hot snare. Resected and retrieved.                           - One 6 mm polyp in the rectum, removed with a cold                            snare. Resected and retrieved.                           -  Diverticulosis in the right colon.                           - Internal hemorrhoids.                           - The examination was otherwise normal on direct                            and retroflexion  views. Recommendation:           - Patient has a contact number available for                            emergencies. The signs and symptoms of potential                            delayed complications were discussed with the                            patient. Return to normal activities tomorrow.                            Written discharge instructions were provided to the                            patient.                           - Resume previous diet.                           - Continue present medications.                           - No aspirin, ibuprofen, naproxen, or other                            non-steroidal anti-inflammatory drugs for 5 days                            after polyp removal.                           - Await pathology results.                           - If pathology confirms that ICV polyp is a sessile                            serrated polyp, surgery is warranted as it cannot                            be removed via colonoscopy.                           - Repeat colonoscopy is recommended for  surveillance. The colonoscopy date will be                            determined after pathology results from today's                            exam become available for review. Henry L. Loletha Carrow, MD 02/21/2017 12:10:52 PM This report has been signed electronically.

## 2017-02-22 ENCOUNTER — Telehealth: Payer: Self-pay

## 2017-02-22 NOTE — Telephone Encounter (Signed)
Left message

## 2017-02-22 NOTE — Telephone Encounter (Signed)
Name identifier, left a message. 

## 2017-02-28 ENCOUNTER — Encounter: Payer: Self-pay | Admitting: Gastroenterology

## 2017-02-28 ENCOUNTER — Telehealth: Payer: Self-pay | Admitting: Gastroenterology

## 2017-02-28 DIAGNOSIS — E291 Testicular hypofunction: Secondary | ICD-10-CM | POA: Diagnosis not present

## 2017-02-28 NOTE — Telephone Encounter (Signed)
[name] [name],    I left Mr. Vallee messages on his mobile number 2/1 and today to let him know the polyp is the kind that will require surgical referral.  He was aware from our conversation of the procedure that this was likely.  Please place referral to [facility] for : large ileocecal valve polyp requiring surgical removal. Send my most recent office note, my colonoscopy and pathology report, as well as the last 2 colonoscopy and pathology reports from Dr. Olevia Perches (2015 and 2012).  A pathology letter will also come to him from the [facility].  Let me know what doctor he will see so I can send them a message.  - HD

## 2017-03-01 NOTE — Telephone Encounter (Signed)
Referral faxed to [facility] 

## 2017-03-03 ENCOUNTER — Other Ambulatory Visit: Payer: Self-pay | Admitting: Family Medicine

## 2017-03-03 NOTE — Telephone Encounter (Signed)
Appointment scheduled with Dr. Dema Severin at [place] on 03-14-2017 @ 1030 am.

## 2017-03-04 DIAGNOSIS — E291 Testicular hypofunction: Secondary | ICD-10-CM | POA: Diagnosis not present

## 2017-03-12 ENCOUNTER — Other Ambulatory Visit: Payer: Self-pay | Admitting: Family Medicine

## 2017-03-14 ENCOUNTER — Ambulatory Visit: Payer: Self-pay | Admitting: Surgery

## 2017-03-14 DIAGNOSIS — K635 Polyp of colon: Secondary | ICD-10-CM | POA: Diagnosis not present

## 2017-03-14 NOTE — H&P (Signed)
CC: Endoscopically unremovable colon polyp HPI: [name] is a very pleasant 71yoM here for evaluation of endoscopically unresectable polyp at the appendiceal orifice and ileocecal valve. He is here today with his wife. He underwent a colonoscopy 02/21/17 with Dr. Loletha Carrow for surveillance. He has a history of rectal bleeding for which she underwent 2 other colonoscopies for-the only one we have record of being 09/2013 with Dr. Olevia Perches. Colonoscopy 09/2013 - 4 sessile polyps were found at the ileocecal valve-carpeted to similar polyp previously removed on last colonoscopy now extending over most ileocecal valve, biopsies taken, second polyp ascending colon 4 mm removed with cold biopsy, third polyp next to it 3 mm, not removed due to difficult location behind a fold, fourth polyp in the rectum 7 mm removed with a cold snare, fifth polyp-rectal removed with biopsies. Pathology showed tubular adenomas without high-grade dysplasia. Cecal polyp showed hyperplastic polyps without evidence of malignancy. Rectal polyps-tubular adenomas without high-grade dysplasia  Colonoscopy with Dr. Loletha Carrow 01/2017- 1. 8 mm polyp adjacent to the appendiceal orifice-multilobulated, not removed 2. Large polyp at ileocecal valve, carpet-like, multilobulated-biopsy-returned sessile serrated polyp 3. 4 mm polyp at the hepatic flexure-piecemeal with forceps-pathology lymphoid aggregate 4. 10 mm polyp in the transverse colon-hyperplastic polyp 5. 82mm polyp in the rectum removed-tubular adenoma 6. Diverticulosis in the right colon 7. Internal hemorrhoids  PMH: PVCs (controlled with carvedilol); asthma/wheezing - controlled with albuterol but rarely needs PSH: Denies any prior abdominal operations FHx: Denies FHx of malignancy Social: He smokes 1-1-1/2 packs per day for 25 years; he reports drinking 3-4 drinks per night; denies illicit drug use ROS: A comprehensive 10 system review of systems was completed with the patient  and pertinent findings as noted above.  The patient is a 52 year old male.   Past Surgical History Benjiman Core, [place]; 03/14/2017 10:32 AM) Colon Polyp Removal - Colonoscopy  Vasectomy   Diagnostic Studies History Benjiman Core, CMA; 03/14/2017 10:32 AM) Colonoscopy  within last year  Allergies Benjiman Core, [place]; 03/14/2017 10:34 AM) No Known Drug Allergies [03/14/2017]:  Medication History Benjiman Core, CMA; 03/14/2017 10:36 AM) Carvedilol (6.25MG  Tablet, Oral) Active. Omeprazole (40MG  Capsule DR, Oral) Active. Testosterone (30MG /ACT Solution, Transdermal) Active. Hyoscyamine Sulfate (0.125MG  Tablet, Oral) Active. Axiron (30MG /ACT Solution, Transdermal) Active. Sildenafil Citrate (100MG  Tablet, Oral) Active. Medications Reconciled  Social History Benjiman Core, CMA; 03/14/2017 10:32 AM) Alcohol use  Moderate alcohol use. Caffeine use  Coffee, Tea. No drug use  Tobacco use  Former smoker.  Family History Benjiman Core, [place]; 03/14/2017 10:32 AM) Colon Polyps  Father. Diabetes Mellitus  Mother.  Other Problems Benjiman Core, CMA; 03/14/2017 10:32 AM) Gastroesophageal Reflux Disease     Review of Systems (Armen Glenn CMA; 03/14/2017 10:32 AM) General Not Present- Appetite Loss, Chills, Fatigue, Fever, Night Sweats, Weight Gain and Weight Loss. Skin Not Present- Change in Wart/Mole, Dryness, Hives, Jaundice, New Lesions, Non-Healing Wounds, Rash and Ulcer. HEENT Present- Seasonal Allergies and Wears glasses/contact lenses. Not Present- Earache, Hearing Loss, Hoarseness, Nose Bleed, Oral Ulcers, Ringing in the Ears, Sinus Pain, Sore Throat, Visual Disturbances and Yellow Eyes. Respiratory Present- Wheezing. Not Present- Bloody sputum, Chronic Cough, Difficulty Breathing and Snoring. Breast Not Present- Breast Mass, Breast Pain, Nipple Discharge and Skin Changes. Cardiovascular Present- Palpitations. Not Present- Chest Pain, Difficulty Breathing Lying Down, Leg Cramps,  Rapid Heart Rate, Shortness of Breath and Swelling of Extremities. Gastrointestinal Present- Bloating. Not Present- Abdominal Pain, Bloody Stool, Change in Bowel Habits, Chronic diarrhea, Constipation, Difficulty Swallowing, Excessive gas, Gets full quickly  at meals, Hemorrhoids, Indigestion, Nausea, Rectal Pain and Vomiting. Male Genitourinary Not Present- Blood in Urine, Change in Urinary Stream, Frequency, Impotence, Nocturia, Painful Urination, Urgency and Urine Leakage. Musculoskeletal Not Present- Back Pain, Joint Pain, Joint Stiffness, Muscle Pain, Muscle Weakness and Swelling of Extremities. Neurological Not Present- Decreased Memory, Fainting, Headaches, Numbness, Seizures, Tingling, Tremor, Trouble walking and Weakness. Psychiatric Not Present- Anxiety, Bipolar, Change in Sleep Pattern, Depression, Fearful and Frequent crying. Endocrine Not Present- Cold Intolerance, Excessive Hunger, Hair Changes, Heat Intolerance, Hot flashes and New Diabetes. Hematology Not Present- Blood Thinners, Easy Bruising, Excessive bleeding, Gland problems, HIV and Persistent Infections.  Vitals (Armen Glenn CMA; 03/14/2017 10:33 AM) 03/14/2017 10:33 AM Weight: 202.5 lb Height: 72in Body Surface Area: 2.14 m Body Mass Index: 27.46 kg/m  Temp.: 98.8F  Pulse: 78 (Regular)  P.OX: 97% (Room air) BP: 154/98 (Sitting, Left Arm, Standard)       Physical Exam Harrell Gave M. Beckham Buxbaum MD; 03/14/2017 11:35 AM) The physical exam findings are as follows: Note:Constitutional: No acute distress; conversant; no deformities Eyes: Moist conjunctiva; no lid lag; anicteric sclerae; pupils equal round and reactive to light Neck: Trachea midline; no palpable thyromegaly Lungs: Normal respiratory effort; no tactile fremitus CV: Regular rate and rhythm; no palpable thrill; no pitting edema GI: Abdomen soft, nontender, nondistended; no palpable hepatosplenomegaly MSK: Normal gait; no  clubbing/cyanosis Psychiatric: Appropriate affect; alert and oriented 3 Lymphatic: No palpable cervical or axillary lymphadenopathy    Assessment & Plan Harrell Gave M. Keyandra Swenson MD; 03/14/2017 11:38 AM) COLON POLYP (K63.5) Impression: Mr. Tromp is a very pleasant 74yoM with endoscopically unresectable polyp at the appendiceal orifice and ileocecal valve. This was not tattooed, however, the appendiceal orifice is visible at the site of the endoscopically unresectable polyps.  -Will schedule for laparoscopic versus open right hemicolectomy -The anatomy and physiology of the GI tract was discussed at length with the patient and his wife. The pathophysiology of polyps, possibility for cancer and treatment approach was discussed at length with associated pictures and illustrations. -The planned procedure, material risks (including, but not limited to, pain, bleeding, infection, scarring, need for blood transfusion, damage to surrounding structures- blood vessels/nerves/viscus/organs, leak from anastomosis, need for additional procedures, need for stoma which may be permanent, hernia, recurrence, pneumonia, heart attack, stroke, death) benefits and alternatives to surgery were discussed at length. The typical preoperative process, postoperative course, recovery, and expectations were additionally discussed. The patient and his wife's questions were answered to their satisfaction and they elected to proceed with surgery.  Sharon Mt. Dema Severin, M.D. General and Colorectal Surgery [facility]

## 2017-03-15 ENCOUNTER — Encounter: Payer: Self-pay | Admitting: Family Medicine

## 2017-03-15 ENCOUNTER — Ambulatory Visit: Payer: BLUE CROSS/BLUE SHIELD | Admitting: Family Medicine

## 2017-03-15 VITALS — BP 110/80 | HR 93 | Temp 98.4°F | Wt 202.6 lb

## 2017-03-15 DIAGNOSIS — K219 Gastro-esophageal reflux disease without esophagitis: Secondary | ICD-10-CM | POA: Diagnosis not present

## 2017-03-15 DIAGNOSIS — Z87898 Personal history of other specified conditions: Secondary | ICD-10-CM | POA: Diagnosis not present

## 2017-03-15 DIAGNOSIS — Z720 Tobacco use: Secondary | ICD-10-CM

## 2017-03-15 MED ORDER — OMEPRAZOLE 40 MG PO CPDR: DELAYED_RELEASE_CAPSULE | ORAL | 3 refills | Status: DC

## 2017-03-15 MED ORDER — ALBUTEROL SULFATE HFA 108 (90 BASE) MCG/ACT IN AERS: INHALATION_SPRAY | RESPIRATORY_TRACT | 5 refills | Status: DC

## 2017-03-15 MED ORDER — BECLOMETHASONE DIPROP HFA 80 MCG/ACT IN AERB
2.0000 | INHALATION_SPRAY | Freq: Two times a day (BID) | RESPIRATORY_TRACT | 5 refills | Status: DC
Start: 2017-03-15 — End: 2018-04-03

## 2017-03-15 NOTE — Progress Notes (Signed)
Subjective:     Patient ID: [name] [name], male   DOB: 05/01/1965, 52 y.o.   MRN: [id number]  HPI Patient seen for the following issues  Requesting refills of medications including omeprazole 40 mg daily and albuterol. He unfortunately still smokes about a pack a day. He states he's had most daily wheezing and cough for the past several weeks. Denies any hemoptysis. No fevers or chills. No appetite or weight changes. Recently, has been using as albuterol usually more than 3 or 4 times per week.  He feels his reflux symptoms are fairly well controlled. Denies any postnasal drip symptoms. Wheezing and cough do seem to respond with albuterol  Multiple colon polyps including tubular adenoma which could not be resected by colonoscopy. He is seeing general surgeon and has been recommended for right hemicolectomy.  Past Medical History:  Diagnosis Date  . Daily headache   . GERD 12/23/2009  . GERD (gastroesophageal reflux disease)   . H/O wheezing    uses inhaler  . IBS (irritable bowel syndrome)   . Internal hemorrhoids   . Nonsustained ventricular tachycardia ([place]) 06/2013  . Pneumonia 10/2012  . PVC (premature ventricular contraction)   . Serrated adenoma of colon   . Shortness of breath    "at rest" (11/22/2012)   Past Surgical History:  Procedure Laterality Date  . COLONOSCOPY    . VASECTOMY  1990's    reports that he has been smoking cigarettes.  He has a 23.00 pack-year smoking history. he has never used smokeless tobacco. He reports that he drinks about 16.8 oz of alcohol per week. He reports that he does not use drugs. family history includes Colon polyps in his father; Diabetes in his maternal grandmother; Prostate cancer in his paternal grandfather; Stomach cancer in his paternal uncle; Stroke in his mother and paternal grandfather. No Known Allergies     Review of Systems  Constitutional: Negative for fatigue.  Eyes: Negative for visual disturbance.  Respiratory:  Positive for cough and wheezing. Negative for chest tightness and shortness of breath.   Cardiovascular: Negative for chest pain, palpitations and leg swelling.  Neurological: Negative for dizziness, syncope, weakness, light-headedness and headaches.       Objective:   Physical Exam  Constitutional: He is oriented to person, place, and time. He appears well-developed and well-nourished.  HENT:  Right Ear: External ear normal.  Left Ear: External ear normal.  Mouth/Throat: Oropharynx is clear and moist.  Eyes: Pupils are equal, round, and reactive to light.  Neck: Neck supple. No thyromegaly present.  Cardiovascular: Normal rate and regular rhythm.  Pulmonary/Chest: Effort normal and breath sounds normal. No respiratory distress. He has no wheezes. He has no rales.  Musculoskeletal: He exhibits no edema.  Neurological: He is alert and oriented to person, place, and time.       Assessment:     #1 GERD stable and well controlled with omeprazole  #2 ongoing nicotine use  #3 history of frequent wheezing which has become almost daily    Plan:     -Refill omeprazole and also refill albuterol for as needed use -We discussed the fact that he probably should have some pulmonary function tests soon to help further clarify his wheezing and cough -Strongly encouraged to quit smoking -Consider trial of Qvar 80 g 2 puffs twice daily and rinse mouth after use -Touch base in 2-3 weeks if cough not improving -Consider pulmonary referral and PFTs if not improved in 2-3 weeks  Alinda Sierras  Anwen Cannedy MD  Primary Care at [facility]

## 2017-03-15 NOTE — Patient Instructions (Signed)
Be sure to rinse mouth after steroid inhaler use Let me know in 2-3 weeks if cough no better.

## 2017-03-21 ENCOUNTER — Other Ambulatory Visit: Payer: Self-pay | Admitting: Family Medicine

## 2017-04-01 ENCOUNTER — Telehealth: Payer: Self-pay | Admitting: Gastroenterology

## 2017-04-01 NOTE — Telephone Encounter (Signed)
I do not know what those changes might be. I will send Dr. Dema Severin a message and ask him or his nurse to clarify with Lanny Hurst.

## 2017-04-01 NOTE — Telephone Encounter (Signed)
Spoke to patient, he is needing clarification. Dr. Orest Dikes nurse called him to let him know that there is a change of plans re: upcoming surgery to remove polyp per Dr. Loletha Carrow. Patient would like to speak to you about what are these changes. Let him know that it would be most likely around noon time before you were able to call him back. Thank you.

## 2017-04-01 NOTE — Telephone Encounter (Signed)
Relayed message to patient, hopefully Dr. Dema Severin or his nurse will call him to clarify what these changes are.

## 2017-04-11 ENCOUNTER — Ambulatory Visit: Payer: Self-pay | Admitting: Surgery

## 2017-04-11 DIAGNOSIS — K635 Polyp of colon: Secondary | ICD-10-CM | POA: Diagnosis not present

## 2017-04-11 NOTE — H&P (Signed)
CC: Follow-up visit  HPI: [name] is a very pleasant 52yoM here for evaluation of endoscopically unresectable polyp at the appendiceal orifice and ileocecal valve. He is here today with his wife. He underwent a colonoscopy 02/21/17 with Dr. Loletha Carrow for surveillance. He has a history of rectal bleeding for which she underwent 2 other colonoscopies for-the only one we have record of being 09/2013 with Dr. Olevia Perches. Colonoscopy 09/2013 - 4 sessile polyps were found at the ileocecal valve-carpeted to similar polyp previously removed on last colonoscopy now extending over most ileocecal valve, biopsies taken, second polyp ascending colon 4 mm removed with cold biopsy, third polyp next to it 3 mm, not removed due to difficult location behind a fold, fourth polyp in the rectum 7 mm removed with a cold snare, fifth polyp-rectal removed with biopsies. Pathology showed tubular adenomas without high-grade dysplasia. Cecal polyp showed hyperplastic polyps without evidence of malignancy. Rectal polyps-tubular adenomas without high-grade dysplasia  Colonoscopy with Dr. Loletha Carrow 01/2017- 1. 8 mm polyp adjacent to the appendiceal orifice-multilobulated, not removed 2. Large polyp at ileocecal valve, carpet-like, multilobulated-biopsy-returned sessile serrated polyp 3. 4 mm polyp at the hepatic flexure-piecemeal with forceps-pathology lymphoid aggregate 4. 10 mm polyp in the transverse colon-hyperplastic polyp - removed 5. 36mm polyp in the rectum removed-tubular adenoma 6. Diverticulosis in the right colon 7. Internal hemorrhoids  PMH: PVCs (controlled with carvedilol); asthma/wheezing - controlled with albuterol but rarely needs PSH: Denies any prior abdominal operations FHx: Denies FHx of malignancy Social: He smokes 1-1-1/2 packs per day for 25 years; he reports drinking 3-4 drinks per night; denies illicit drug use ROS: A comprehensive 10 system review of systems was completed with the patient and  pertinent findings as noted above.  The patient is a 52 year old male.   Allergies Andee Poles Gerrigner, CMA; 04/11/2017 8:58 AM) No Known Drug Allergies [04/11/2017]: Allergies Reconciled   Medication History Levonne Spiller, CMA; 04/11/2017 8:59 AM) Carvedilol (6.25MG  Tablet, Oral) Active. Omeprazole (40MG  Capsule DR, Oral) Active. Testosterone (30MG /ACT Solution, Transdermal) Active. Hyoscyamine Sulfate (0.125MG  Tablet, Oral) Active. Axiron (30MG /ACT Solution, Transdermal) Active. Sildenafil Citrate (100MG  Tablet, Oral) Active. Medications Reconciled    Review of Systems Harrell Gave M. Zethan Alfieri MD; 04/11/2017 9:23 AM) General Not Present- Appetite Loss, Chills, Fatigue, Fever, Night Sweats, Weight Gain and Weight Loss. Skin Not Present- Change in Wart/Mole, Dryness, Hives, Jaundice, New Lesions, Non-Healing Wounds, Rash and Ulcer. HEENT Present- Seasonal Allergies and Wears glasses/contact lenses. Not Present- Earache, Hearing Loss, Hoarseness, Nose Bleed, Oral Ulcers, Ringing in the Ears, Sinus Pain, Sore Throat, Visual Disturbances and Yellow Eyes. Respiratory Present- Wheezing. Not Present- Bloody sputum, Chronic Cough, Difficulty Breathing and Snoring. Breast Not Present- Breast Mass, Breast Pain, Nipple Discharge and Skin Changes. Cardiovascular Present- Palpitations. Not Present- Chest Pain, Difficulty Breathing Lying Down, Leg Cramps, Rapid Heart Rate, Shortness of Breath and Swelling of Extremities. Gastrointestinal Present- Bloating. Not Present- Abdominal Pain, Bloody Stool, Change in Bowel Habits, Chronic diarrhea, Constipation, Difficulty Swallowing, Excessive gas, Gets full quickly at meals, Hemorrhoids, Indigestion, Nausea, Rectal Pain and Vomiting. Male Genitourinary Not Present- Blood in Urine, Change in Urinary Stream, Frequency, Impotence, Nocturia, Painful Urination, Urgency and Urine Leakage. Musculoskeletal Not Present- Back Pain, Joint Pain, Joint  Stiffness, Muscle Pain, Muscle Weakness and Swelling of Extremities. Neurological Not Present- Decreased Memory, Fainting, Headaches, Numbness, Seizures, Tingling, Tremor, Trouble walking and Weakness. Psychiatric Not Present- Anxiety, Bipolar, Change in Sleep Pattern, Depression, Fearful and Frequent crying. Endocrine Not Present- Cold Intolerance, Excessive Hunger, Hair Changes, Heat Intolerance and  New Diabetes. Hematology Not Present- Blood Thinners, Easy Bruising, Excessive bleeding, Gland problems, HIV and Persistent Infections.  Vitals (Danielle Gerrigner CMA; 04/11/2017 8:59 AM) 04/11/2017 8:59 AM Weight: 207.5 lb Height: 72in Body Surface Area: 2.16 m Body Mass Index: 28.14 kg/m  Temp.: 98.51F(Oral)  Pulse: 83 (Regular)  BP: 146/102 (Sitting, Left Arm, Standard)       Physical Exam Harrell Gave M. Joselynn Amoroso MD; 04/11/2017 9:25 AM) The physical exam findings are as follows: Note:Constitutional: No acute distress; conversant; no deformities Eyes: Moist conjunctiva; no lid lag; anicteric sclerae; pupils equal round and reactive to light Neck: Trachea midline; no palpable thyromegaly Lungs: Normal respiratory effort; no tactile fremitus CV: Regular rate and rhythm; no palpable thrill; no pitting edema GI: Abdomen soft, nontender, nondistended; no palpable hepatosplenomegaly MSK: Normal gait; no clubbing/cyanosis Psychiatric: Appropriate affect; alert and oriented 3 Lymphatic: No palpable cervical or axillary lymphadenopathy    Assessment & Plan [place] Gave M. Jazelle Achey MD; 04/11/2017 9:31 AM) COLON POLYP (K63.5) Impression: [name] is a very pleasant 52yoM with endoscopically unresectable polyp at the appendiceal orifice and ileocecal valve. This was not tattooed, however, the appendiceal orifice is visible at the site of the endoscopically unresectable polyps.  -Originally scheduled for a lap versus open R hemicolectomy, however, has thought about things and wishes  to pursue a ileocecectomy. We discussed the recommendation for a R hemicolectomy, namely, the risk that this polyp is harboring cancer, in which case an ileocecectomy would be an inadequate operation. We discussed that should he undergo an ileocecectomy and have pathology demonstrate cancer, he would likely need adjuvant chemotherapy or repeat operation as the lymph nodes would be inadequate and number to properly stage and clear the draining basins of possible cancer. He acknowledges this risk and the potential need for chemotherapy/surgery as well as an inadequate resection. He has elected to pursue ileocecectomy. We discussed the postoperative functional expectations from either a R hemicolectomy or ileocecectomy and the postoperative outcomes are often very similar. These pictures and illustrations demonstrating all the above and lymph node drainage basins in an attempt to further educate him on his decision -The anatomy and physiology of the GI tract was discussed at length with the patient and his wife. The pathophysiology of polyps, possibility for cancer and treatment approach was discussed at length with associated pictures and illustrations. -The planned procedure, material risks (including, but not limited to, pain, bleeding, infection, scarring, need for blood transfusion, damage to surrounding structures- blood vessels/nerves/viscus/organs, leak from anastomosis, need for additional procedures, need for stoma which may be permanent, hernia, recurrence, pneumonia, heart attack, stroke, death) benefits and alternatives to surgery were discussed. His questions were answered. He voiced understanding and has elected to proceed with surgery.  Signed electronically by Ileana Roup, MD (04/11/2017 9:32 AM)

## 2017-04-13 ENCOUNTER — Encounter (HOSPITAL_COMMUNITY): Payer: Self-pay

## 2017-04-13 NOTE — Pre-Procedure Instructions (Signed)
Last office visit 03/15/17 Dr. Elease Hashimoto in epic.

## 2017-04-13 NOTE — Patient Instructions (Addendum)
Your procedure is scheduled on: Wednesday, April 20, 2017   Surgery Time:  11:30AM-2:30PM   Report to [facility] Main  Entrance    Report to admitting at 9:30 AM   Call this number if you have problems the morning of surgery [phone]   Do not eat food:After Midnight.   Do NOT smoke after Midnight    Clear liquids after midnight. Nothing by mouth 3 hours prior to scheduled surgery.    Drink 2 Ensure drinks the night before surgery at 10:00PM.     Complete one Ensure drink the morning of surgery 3 hours prior to scheduled surgery by 8:30AM   Take these medicines the morning of surgery with A SIP OF WATER: Carvedilol, Omeprazole   Use inhaler per normal routine   Bring rescue Asthma Inhaler day of surgery                               You may not have any metal on your body including jewelry, and body piercings             Do not wear lotions, powders, perfumes/cologne, or deodorant                           Men may shave face and neck.   Do not bring valuables to the hospital. [place].   Contacts, dentures or bridgework may not be worn into surgery.   Leave suitcase in the car. After surgery it may be brought to your room.     Special Instructions: Bring a copy of your healthcare power of attorney and living will documents         the day of surgery if you haven't scanned them in before.              Please read over the following fact sheets you were given:  [facility] - Preparing for Surgery Before surgery, you can play an important role.  Because skin is not sterile, your skin needs to be as free of germs as possible.  You can reduce the number of germs on your skin by washing with CHG (chlorahexidine gluconate) soap before surgery.  CHG is an antiseptic cleaner which kills germs and bonds with the skin to continue killing germs even after washing. Please DO NOT use if you have an allergy to CHG or  antibacterial soaps.  If your skin becomes reddened/irritated stop using the CHG and inform your nurse when you arrive at Short Stay. Do not shave (including legs and underarms) for at least 48 hours prior to the first CHG shower.  You may shave your face/neck.  Please follow these instructions carefully:  1.  Shower with CHG Soap the night before surgery and the  morning of surgery.  2.  If you choose to wash your hair, wash your hair first as usual with your normal  shampoo.  3.  After you shampoo, rinse your hair and body thoroughly to remove the shampoo.                             4.  Use CHG as you would any other liquid soap.  You can apply chg directly to the skin  and wash.  Gently with a scrungie or clean washcloth.  5.  Apply the CHG Soap to your body ONLY FROM THE NECK DOWN.   Do   not use on face/ open                           Wound or open sores. Avoid contact with eyes, ears mouth and   genitals (private parts).                       Wash face,  Genitals (private parts) with your normal soap.             6.  Wash thoroughly, paying special attention to the area where your    surgery  will be performed.  7.  Thoroughly rinse your body with warm water from the neck down.  8.  DO NOT shower/wash with your normal soap after using and rinsing off the CHG Soap.                9.  Pat yourself dry with a clean towel.            10.  Wear clean pajamas.            11.  Place clean sheets on your bed the night of your first shower and do not  sleep with pets. Day of Surgery : Do not apply any lotions/deodorants the morning of surgery.  Please wear clean clothes to the hospital/surgery center.  FAILURE TO FOLLOW THESE INSTRUCTIONS MAY RESULT IN THE CANCELLATION OF YOUR SURGERY  PATIENT SIGNATURE_________________________________  NURSE SIGNATURE__________________________________  ________________________________________________________________________   [name] [name]  An  incentive spirometer is a tool that can help keep your lungs clear and active. This tool measures how well you are filling your lungs with each breath. Taking long deep breaths may help reverse or decrease the chance of developing breathing (pulmonary) problems (especially infection) following:  A long period of time when you are unable to move or be active. BEFORE THE PROCEDURE   If the spirometer includes an indicator to show your best effort, your nurse or respiratory therapist will set it to a desired goal.  If possible, sit up straight or lean slightly forward. Try not to slouch.  Hold the incentive spirometer in an upright position. INSTRUCTIONS FOR USE  1. Sit on the edge of your bed if possible, or sit up as far as you can in bed or on a chair. 2. Hold the incentive spirometer in an upright position. 3. Breathe out normally. 4. Place the mouthpiece in your mouth and seal your lips tightly around it. 5. Breathe in slowly and as deeply as possible, raising the piston or the ball toward the top of the column. 6. Hold your breath for 3-5 seconds or for as long as possible. Allow the piston or ball to fall to the bottom of the column. 7. Remove the mouthpiece from your mouth and breathe out normally. 8. Rest for a few seconds and repeat Steps 1 through 7 at least 10 times every 1-2 hours when you are awake. Take your time and take a few normal breaths between deep breaths. 9. The spirometer may include an indicator to show your best effort. Use the indicator as a goal to work toward during each repetition. 10. After each set of 10 deep breaths, practice coughing to be sure your lungs are clear. If you have  an incision (the cut made at the time of surgery), support your incision when coughing by placing a pillow or rolled up towels firmly against it. Once you are able to get out of bed, walk around indoors and cough well. You may stop using the incentive spirometer when instructed by your  caregiver.  RISKS AND COMPLICATIONS  Take your time so you do not get dizzy or light-headed.  If you are in pain, you may need to take or ask for pain medication before doing incentive spirometry. It is harder to take a deep breath if you are having pain. AFTER USE  Rest and breathe slowly and easily.  It can be helpful to keep track of a log of your progress. Your caregiver can provide you with a simple table to help with this. If you are using the spirometer at home, follow these instructions: [place] IF:   You are having difficultly using the spirometer.  You have trouble using the spirometer as often as instructed.  Your pain medication is not giving enough relief while using the spirometer.  You develop fever of 100.5 F (38.1 C) or higher. SEEK IMMEDIATE MEDICAL CARE IF:   You cough up bloody sputum that had not been present before.  You develop fever of 102 F (38.9 C) or greater.  You develop worsening pain at or near the incision site. MAKE SURE YOU:   Understand these instructions.  Will watch your condition.  Will get help right away if you are not doing well or get worse. Document Released: 05/24/2006 Document Revised: 04/05/2011 Document Reviewed: 07/25/2006 ExitCare Patient Information 2014 Exit[employer], [place].   ________________________________________________________________________  WHAT IS A BLOOD TRANSFUSION? Blood Transfusion Information  A transfusion is the replacement of blood or some of its parts. Blood is made up of multiple cells which provide different functions.  Red blood cells carry oxygen and are used for blood loss replacement.  White blood cells fight against infection.  Platelets control bleeding.  Plasma helps clot blood.  Other blood products are available for specialized needs, such as hemophilia or other clotting disorders. BEFORE THE TRANSFUSION  Who gives blood for transfusions?   Healthy volunteers who are fully  evaluated to make sure their blood is safe. This is blood bank blood. Transfusion therapy is the safest it has ever been in the practice of medicine. Before blood is taken from a donor, a complete history is taken to make sure that person has no history of diseases nor engages in risky social behavior (examples are intravenous drug use or sexual activity with multiple partners). The donor's travel history is screened to minimize risk of transmitting infections, such as malaria. The donated blood is tested for signs of infectious diseases, such as HIV and hepatitis. The blood is then tested to be sure it is compatible with you in order to minimize the chance of a transfusion reaction. If you or a relative donates blood, this is often done in anticipation of surgery and is not appropriate for emergency situations. It takes many days to process the donated blood. RISKS AND COMPLICATIONS Although transfusion therapy is very safe and saves many lives, the main dangers of transfusion include:   Getting an infectious disease.  Developing a transfusion reaction. This is an allergic reaction to something in the blood you were given. Every precaution is taken to prevent this. The decision to have a blood transfusion has been considered carefully by your caregiver before blood is given. Blood is not  given unless the benefits outweigh the risks. AFTER THE TRANSFUSION  Right after receiving a blood transfusion, you will usually feel much better and more energetic. This is especially true if your red blood cells have gotten low (anemic). The transfusion raises the level of the red blood cells which carry oxygen, and this usually causes an energy increase.  The nurse administering the transfusion will monitor you carefully for complications. HOME CARE INSTRUCTIONS  No special instructions are needed after a transfusion. You may find your energy is better. Speak with your caregiver about any limitations on activity  for underlying diseases you may have. SEEK MEDICAL CARE IF:   Your condition is not improving after your transfusion.  You develop redness or irritation at the intravenous (IV) site. SEEK IMMEDIATE MEDICAL CARE IF:  Any of the following symptoms occur over the next 12 hours:  Shaking chills.  You have a temperature by mouth above 102 F (38.9 C), not controlled by medicine.  Chest, back, or muscle pain.  People around you feel you are not acting correctly or are confused.  Shortness of breath or difficulty breathing.  Dizziness and fainting.  You get a rash or develop hives.  You have a decrease in urine output.  Your urine turns a dark color or changes to pink, red, or brown. Any of the following symptoms occur over the next 10 days:  You have a temperature by mouth above 102 F (38.9 C), not controlled by medicine.  Shortness of breath.  Weakness after normal activity.  The white part of the eye turns yellow (jaundice).  You have a decrease in the amount of urine or are urinating less often.  Your urine turns a dark color or changes to pink, red, or brown. Document Released: 01/09/2000 Document Revised: 04/05/2011 Document Reviewed: 08/28/2007 [facility] Patient Information 2014 [place], [place].  _______________________________________________________________________

## 2017-04-14 ENCOUNTER — Encounter (HOSPITAL_COMMUNITY): Payer: Self-pay

## 2017-04-14 ENCOUNTER — Encounter (HOSPITAL_COMMUNITY)
Admission: RE | Admit: 2017-04-14 | Discharge: 2017-04-14 | Disposition: A | Discharge status: Home / Self Care | Payer: BLUE CROSS/BLUE SHIELD | Source: Ambulatory Visit | Attending: Surgery | Admitting: Surgery

## 2017-04-14 ENCOUNTER — Other Ambulatory Visit: Payer: Self-pay

## 2017-04-14 DIAGNOSIS — Z01818 Encounter for other preprocedural examination: Secondary | ICD-10-CM | POA: Diagnosis not present

## 2017-04-14 DIAGNOSIS — K635 Polyp of colon: Secondary | ICD-10-CM | POA: Diagnosis not present

## 2017-04-14 DIAGNOSIS — Z01810 Encounter for preprocedural cardiovascular examination: Secondary | ICD-10-CM | POA: Diagnosis not present

## 2017-04-14 HISTORY — DX: Essential (primary) hypertension: I10

## 2017-04-14 HISTORY — DX: Unspecified asthma, uncomplicated: J45.909

## 2017-04-14 HISTORY — DX: Cardiac arrhythmia, unspecified: I49.9

## 2017-04-14 HISTORY — DX: Diverticulosis of large intestine without perforation or abscess without bleeding: K57.30

## 2017-04-14 HISTORY — DX: Benign prostatic hyperplasia with lower urinary tract symptoms: N40.1

## 2017-04-14 HISTORY — DX: Personal history of other diseases of the digestive system: Z87.19

## 2017-04-14 LAB — COMPREHENSIVE METABOLIC PANEL
ALT: 38 U/L (ref 17–63)
ANION GAP: 10 (ref 5–15)
AST: 36 U/L (ref 15–41)
Albumin: 4.3 g/dL (ref 3.5–5.0)
Alkaline Phosphatase: 14 U/L — ABNORMAL LOW (ref 38–126)
BUN: 17 mg/dL (ref 6–20)
CHLORIDE: 103 mmol/L (ref 101–111)
CO2: 28 mmol/L (ref 22–32)
CREATININE: 0.83 mg/dL (ref 0.61–1.24)
Calcium: 9.8 mg/dL (ref 8.9–10.3)
Glucose, Bld: 110 mg/dL — ABNORMAL HIGH (ref 65–99)
Potassium: 5.2 mmol/L — ABNORMAL HIGH (ref 3.5–5.1)
Sodium: 141 mmol/L (ref 135–145)
Total Bilirubin: 0.8 mg/dL (ref 0.3–1.2)
Total Protein: 7.6 g/dL (ref 6.5–8.1)

## 2017-04-14 LAB — CBC WITH DIFFERENTIAL/PLATELET
Basophils Absolute: 0.1 10*3/uL (ref 0.0–0.1)
Basophils Relative: 1 %
EOS ABS: 0.2 10*3/uL (ref 0.0–0.7)
EOS PCT: 3 %
HCT: 49.6 % (ref 39.0–52.0)
Hemoglobin: 17.2 g/dL — ABNORMAL HIGH (ref 13.0–17.0)
LYMPHS ABS: 1.7 10*3/uL (ref 0.7–4.0)
Lymphocytes Relative: 21 %
MCH: 33 pg (ref 26.0–34.0)
MCHC: 34.7 g/dL (ref 30.0–36.0)
MCV: 95.2 fL (ref 78.0–100.0)
MONO ABS: 0.9 10*3/uL (ref 0.1–1.0)
MONOS PCT: 11 %
Neutro Abs: 5.1 10*3/uL (ref 1.7–7.7)
Neutrophils Relative %: 64 %
PLATELETS: 266 10*3/uL (ref 150–400)
RBC: 5.21 MIL/uL (ref 4.22–5.81)
RDW: 13.2 % (ref 11.5–15.5)
WBC: 8 10*3/uL (ref 4.0–10.5)

## 2017-04-14 LAB — HEMOGLOBIN A1C
HEMOGLOBIN A1C: 5 % (ref 4.8–5.6)
MEAN PLASMA GLUCOSE: 96.8 mg/dL

## 2017-04-14 LAB — ABO/RH: ABO/RH(D): A POS

## 2017-04-14 NOTE — Progress Notes (Signed)
FYI pt. BP at preop appt was 167/112. He is scheduled for surgery on 04-20-17.

## 2017-04-15 NOTE — Progress Notes (Signed)
Final ekg done 04-14-17 in epic 

## 2017-04-20 ENCOUNTER — Encounter (HOSPITAL_COMMUNITY): Payer: Self-pay | Admitting: *Deleted

## 2017-04-20 ENCOUNTER — Encounter (HOSPITAL_COMMUNITY)
Admission: RE | Disposition: A | Discharge status: Home / Self Care | Payer: Self-pay | Source: Ambulatory Visit | Attending: Surgery

## 2017-04-20 ENCOUNTER — Inpatient Hospital Stay: Admit: 2017-04-20 | Payer: BLUE CROSS/BLUE SHIELD | Admitting: Surgery

## 2017-04-20 ENCOUNTER — Inpatient Hospital Stay (HOSPITAL_COMMUNITY): Payer: BLUE CROSS/BLUE SHIELD | Admitting: Registered Nurse

## 2017-04-20 ENCOUNTER — Inpatient Hospital Stay (HOSPITAL_COMMUNITY)
Admission: RE | Admit: 2017-04-20 | Discharge: 2017-04-23 | DRG: 331 | Disposition: A | Discharge status: Home / Self Care | Payer: BLUE CROSS/BLUE SHIELD | Source: Ambulatory Visit | Attending: Surgery | Admitting: Surgery

## 2017-04-20 DIAGNOSIS — I10 Essential (primary) hypertension: Secondary | ICD-10-CM | POA: Diagnosis not present

## 2017-04-20 DIAGNOSIS — D120 Benign neoplasm of cecum: Principal | ICD-10-CM | POA: Diagnosis present

## 2017-04-20 DIAGNOSIS — J45909 Unspecified asthma, uncomplicated: Secondary | ICD-10-CM | POA: Diagnosis present

## 2017-04-20 DIAGNOSIS — Z5331 Laparoscopic surgical procedure converted to open procedure: Secondary | ICD-10-CM | POA: Diagnosis not present

## 2017-04-20 DIAGNOSIS — Z79899 Other long term (current) drug therapy: Secondary | ICD-10-CM

## 2017-04-20 DIAGNOSIS — K635 Polyp of colon: Secondary | ICD-10-CM | POA: Diagnosis present

## 2017-04-20 DIAGNOSIS — F17210 Nicotine dependence, cigarettes, uncomplicated: Secondary | ICD-10-CM | POA: Diagnosis present

## 2017-04-20 DIAGNOSIS — K219 Gastro-esophageal reflux disease without esophagitis: Secondary | ICD-10-CM | POA: Diagnosis present

## 2017-04-20 DIAGNOSIS — D122 Benign neoplasm of ascending colon: Secondary | ICD-10-CM | POA: Diagnosis not present

## 2017-04-20 HISTORY — PX: LAPAROSCOPIC RIGHT HEMI COLECTOMY: SHX5926

## 2017-04-20 LAB — CBC WITH DIFFERENTIAL/PLATELET
BASOS PCT: 1 %
Basophils Absolute: 0.1 10*3/uL (ref 0.0–0.1)
EOS ABS: 0.2 10*3/uL (ref 0.0–0.7)
EOS PCT: 2 %
HCT: 49.3 % (ref 39.0–52.0)
HEMOGLOBIN: 17.3 g/dL — AB (ref 13.0–17.0)
LYMPHS ABS: 1.7 10*3/uL (ref 0.7–4.0)
Lymphocytes Relative: 20 %
MCH: 33 pg (ref 26.0–34.0)
MCHC: 35.1 g/dL (ref 30.0–36.0)
MCV: 93.9 fL (ref 78.0–100.0)
Monocytes Absolute: 1.1 10*3/uL — ABNORMAL HIGH (ref 0.1–1.0)
Monocytes Relative: 13 %
NEUTROS PCT: 64 %
Neutro Abs: 5.7 10*3/uL (ref 1.7–7.7)
PLATELETS: 268 10*3/uL (ref 150–400)
RBC: 5.25 MIL/uL (ref 4.22–5.81)
RDW: 12.8 % (ref 11.5–15.5)
WBC: 8.8 10*3/uL (ref 4.0–10.5)

## 2017-04-20 LAB — TYPE AND SCREEN
ABO/RH(D): A POS
Antibody Screen: NEGATIVE

## 2017-04-20 LAB — PROTIME-INR
INR: 0.96
PROTHROMBIN TIME: 12.7 s (ref 11.4–15.2)

## 2017-04-20 LAB — APTT: APTT: 26 s (ref 24–36)

## 2017-04-20 SURGERY — LAPAROSCOPIC RIGHT HEMI COLECTOMY
Anesthesia: General | Site: Abdomen | Laterality: Right

## 2017-04-20 SURGERY — LAPAROSCOPIC RIGHT HEMI COLECTOMY
Anesthesia: General

## 2017-04-20 MED ORDER — LACTATED RINGERS IV SOLN
INTRAVENOUS | Status: DC
  Administered 2017-04-20: 11:00:00 via INTRAVENOUS

## 2017-04-20 MED ORDER — LIDOCAINE 2% (20 MG/ML) 5 ML SYRINGE
INTRAMUSCULAR | Status: DC | PRN
  Administered 2017-04-20: 80 mg via INTRAVENOUS

## 2017-04-20 MED ORDER — BUPIVACAINE-EPINEPHRINE 0.5% -1:200000 IJ SOLN
INTRAMUSCULAR | Status: DC | PRN
  Administered 2017-04-20: 15 mL

## 2017-04-20 MED ORDER — LACTATED RINGERS IV SOLN
INTRAVENOUS | Status: DC
  Administered 2017-04-20: 15:00:00 via INTRAVENOUS

## 2017-04-20 MED ORDER — SACCHAROMYCES BOULARDII 250 MG PO CAPS
250.0000 mg | ORAL_CAPSULE | Freq: Two times a day (BID) | ORAL | Status: DC
  Administered 2017-04-20 – 2017-04-23 (×6): 250 mg via ORAL
  Filled 2017-04-20 (×6): qty 1

## 2017-04-20 MED ORDER — BUPIVACAINE HCL (PF) 0.25 % IJ SOLN
INTRAMUSCULAR | Status: AC
  Filled 2017-04-20: qty 30

## 2017-04-20 MED ORDER — LIDOCAINE 2% (20 MG/ML) 5 ML SYRINGE
INTRAMUSCULAR | Status: DC | PRN
  Administered 2017-04-20: 1 mg/kg/h via INTRAVENOUS

## 2017-04-20 MED ORDER — CHLORHEXIDINE GLUCONATE CLOTH 2 % EX PADS: 6.0000 | MEDICATED_PAD | Freq: Once | CUTANEOUS | Status: DC

## 2017-04-20 MED ORDER — FENTANYL CITRATE (PF) 100 MCG/2ML IJ SOLN
INTRAMUSCULAR | Status: AC
  Filled 2017-04-20: qty 2

## 2017-04-20 MED ORDER — CEFOTETAN DISODIUM-DEXTROSE 2-2.08 GM-%(50ML) IV SOLR
2.0000 g | INTRAVENOUS | Status: AC
  Administered 2017-04-20: 2 g via INTRAVENOUS
  Filled 2017-04-20: qty 50

## 2017-04-20 MED ORDER — ALBUTEROL SULFATE (2.5 MG/3ML) 0.083% IN NEBU: 2.5000 mg | INHALATION_SOLUTION | Freq: Four times a day (QID) | RESPIRATORY_TRACT | Status: DC | PRN

## 2017-04-20 MED ORDER — ONDANSETRON HCL 4 MG/2ML IJ SOLN: 4.0000 mg | Freq: Four times a day (QID) | INTRAMUSCULAR | Status: DC | PRN

## 2017-04-20 MED ORDER — GABAPENTIN 300 MG PO CAPS: 300.0000 mg | ORAL_CAPSULE | ORAL | Status: DC

## 2017-04-20 MED ORDER — ACETAMINOPHEN 500 MG PO TABS
1000.0000 mg | ORAL_TABLET | ORAL | Status: AC
  Administered 2017-04-20: 1000 mg via ORAL
  Filled 2017-04-20: qty 2

## 2017-04-20 MED ORDER — CELECOXIB 200 MG PO CAPS
200.0000 mg | ORAL_CAPSULE | ORAL | Status: AC
  Administered 2017-04-20: 200 mg via ORAL
  Filled 2017-04-20: qty 1

## 2017-04-20 MED ORDER — BUPIVACAINE-EPINEPHRINE (PF) 0.5% -1:200000 IJ SOLN
INTRAMUSCULAR | Status: AC
  Filled 2017-04-20: qty 30

## 2017-04-20 MED ORDER — MIDAZOLAM HCL 2 MG/2ML IJ SOLN
INTRAMUSCULAR | Status: AC
  Filled 2017-04-20: qty 2

## 2017-04-20 MED ORDER — TRAMADOL HCL 50 MG PO TABS
50.0000 mg | ORAL_TABLET | Freq: Four times a day (QID) | ORAL | Status: DC | PRN
  Administered 2017-04-22 – 2017-04-23 (×2): 50 mg via ORAL
  Filled 2017-04-20 (×4): qty 1

## 2017-04-20 MED ORDER — HYDRALAZINE HCL 20 MG/ML IJ SOLN: 10.0000 mg | INTRAMUSCULAR | Status: DC | PRN

## 2017-04-20 MED ORDER — METRONIDAZOLE 500 MG PO TABS: 1000.0000 mg | ORAL_TABLET | ORAL | Status: DC

## 2017-04-20 MED ORDER — ACETAMINOPHEN 325 MG PO TABS
650.0000 mg | ORAL_TABLET | Freq: Four times a day (QID) | ORAL | Status: DC
  Administered 2017-04-20 – 2017-04-23 (×12): 650 mg via ORAL
  Filled 2017-04-20 (×12): qty 2

## 2017-04-20 MED ORDER — EPHEDRINE SULFATE-NACL 50-0.9 MG/10ML-% IV SOSY
PREFILLED_SYRINGE | INTRAVENOUS | Status: DC | PRN
  Administered 2017-04-20: 5 mg via INTRAVENOUS

## 2017-04-20 MED ORDER — ROCURONIUM BROMIDE 10 MG/ML (PF) SYRINGE
PREFILLED_SYRINGE | INTRAVENOUS | Status: DC | PRN
  Administered 2017-04-20: 20 mg via INTRAVENOUS
  Administered 2017-04-20: 10 mg via INTRAVENOUS
  Administered 2017-04-20: 5 mg via INTRAVENOUS
  Administered 2017-04-20: 20 mg via INTRAVENOUS
  Administered 2017-04-20: 10 mg via INTRAVENOUS
  Administered 2017-04-20: 50 mg via INTRAVENOUS
  Administered 2017-04-20: 10 mg via INTRAVENOUS

## 2017-04-20 MED ORDER — CARVEDILOL 6.25 MG PO TABS
6.2500 mg | ORAL_TABLET | Freq: Two times a day (BID) | ORAL | Status: DC
  Administered 2017-04-20 – 2017-04-23 (×6): 6.25 mg via ORAL
  Filled 2017-04-20 (×6): qty 1

## 2017-04-20 MED ORDER — EPHEDRINE 5 MG/ML INJ
INTRAVENOUS | Status: AC
  Filled 2017-04-20: qty 10

## 2017-04-20 MED ORDER — PROPOFOL 10 MG/ML IV BOLUS
INTRAVENOUS | Status: DC | PRN
  Administered 2017-04-20: 200 mg via INTRAVENOUS

## 2017-04-20 MED ORDER — FENTANYL CITRATE (PF) 100 MCG/2ML IJ SOLN
25.0000 ug | INTRAMUSCULAR | Status: DC | PRN
  Administered 2017-04-20: 50 ug via INTRAVENOUS

## 2017-04-20 MED ORDER — PROPOFOL 10 MG/ML IV BOLUS
INTRAVENOUS | Status: AC
  Filled 2017-04-20: qty 20

## 2017-04-20 MED ORDER — HEPARIN SODIUM (PORCINE) 5000 UNIT/ML IJ SOLN
5000.0000 [IU] | Freq: Once | INTRAMUSCULAR | Status: AC
  Administered 2017-04-20: 5000 [IU] via SUBCUTANEOUS
  Filled 2017-04-20: qty 1

## 2017-04-20 MED ORDER — SODIUM CHLORIDE 0.9 % IJ SOLN
INTRAMUSCULAR | Status: AC
  Filled 2017-04-20: qty 100

## 2017-04-20 MED ORDER — FENTANYL CITRATE (PF) 100 MCG/2ML IJ SOLN
INTRAMUSCULAR | Status: DC | PRN
  Administered 2017-04-20 (×2): 50 ug via INTRAVENOUS
  Administered 2017-04-20: 100 ug via INTRAVENOUS
  Administered 2017-04-20 (×2): 50 ug via INTRAVENOUS

## 2017-04-20 MED ORDER — MIDAZOLAM HCL 5 MG/5ML IJ SOLN
INTRAMUSCULAR | Status: DC | PRN
  Administered 2017-04-20: 2 mg via INTRAVENOUS

## 2017-04-20 MED ORDER — GABAPENTIN 300 MG PO CAPS
300.0000 mg | ORAL_CAPSULE | ORAL | Status: AC
  Administered 2017-04-20: 300 mg via ORAL
  Filled 2017-04-20: qty 1

## 2017-04-20 MED ORDER — ALVIMOPAN 12 MG PO CAPS: 12.0000 mg | ORAL_CAPSULE | ORAL | Status: DC

## 2017-04-20 MED ORDER — ALBUTEROL SULFATE HFA 108 (90 BASE) MCG/ACT IN AERS: 2.0000 | INHALATION_SPRAY | Freq: Four times a day (QID) | RESPIRATORY_TRACT | Status: DC | PRN

## 2017-04-20 MED ORDER — LIDOCAINE HCL 2 % IJ SOLN
INTRAMUSCULAR | Status: AC
  Filled 2017-04-20: qty 20

## 2017-04-20 MED ORDER — ONDANSETRON HCL 4 MG/2ML IJ SOLN
INTRAMUSCULAR | Status: DC | PRN
  Administered 2017-04-20: 4 mg via INTRAVENOUS

## 2017-04-20 MED ORDER — LABETALOL HCL 5 MG/ML IV SOLN
INTRAVENOUS | Status: DC | PRN
  Administered 2017-04-20: 5 mg via INTRAVENOUS

## 2017-04-20 MED ORDER — BUDESONIDE 0.25 MG/2ML IN SUSP
0.2500 mg | Freq: Two times a day (BID) | RESPIRATORY_TRACT | Status: DC
Start: 2017-04-20 — End: 2017-04-23
  Filled 2017-04-20: qty 2

## 2017-04-20 MED ORDER — PANTOPRAZOLE SODIUM 40 MG PO TBEC
40.0000 mg | DELAYED_RELEASE_TABLET | Freq: Every day | ORAL | Status: DC
  Administered 2017-04-21 – 2017-04-23 (×3): 40 mg via ORAL
  Filled 2017-04-20 (×3): qty 1

## 2017-04-20 MED ORDER — ALVIMOPAN 12 MG PO CAPS
12.0000 mg | ORAL_CAPSULE | ORAL | Status: AC
  Administered 2017-04-20: 12 mg via ORAL
  Filled 2017-04-20: qty 1

## 2017-04-20 MED ORDER — ROCURONIUM BROMIDE 10 MG/ML (PF) SYRINGE
PREFILLED_SYRINGE | INTRAVENOUS | Status: AC
  Filled 2017-04-20: qty 5

## 2017-04-20 MED ORDER — SODIUM CHLORIDE 0.9 % IR SOLN
Status: DC | PRN
  Administered 2017-04-20: 3000 mL

## 2017-04-20 MED ORDER — ONDANSETRON HCL 4 MG PO TABS
4.0000 mg | ORAL_TABLET | Freq: Four times a day (QID) | ORAL | Status: DC | PRN
Start: 2017-04-20 — End: 2017-04-23

## 2017-04-20 MED ORDER — HYDROMORPHONE HCL 1 MG/ML IJ SOLN
0.5000 mg | INTRAMUSCULAR | Status: DC | PRN
  Administered 2017-04-21: 0.5 mg via INTRAVENOUS
  Filled 2017-04-20: qty 0.5

## 2017-04-20 MED ORDER — ACETAMINOPHEN 500 MG PO TABS: 1000.0000 mg | ORAL_TABLET | ORAL | Status: DC

## 2017-04-20 MED ORDER — ALVIMOPAN 12 MG PO CAPS: 12.0000 mg | ORAL_CAPSULE | Freq: Two times a day (BID) | ORAL | Status: DC

## 2017-04-20 MED ORDER — GLYCOPYRROLATE 0.2 MG/ML IV SOSY
PREFILLED_SYRINGE | INTRAVENOUS | Status: DC | PRN
  Administered 2017-04-20: .2 mg via INTRAVENOUS

## 2017-04-20 MED ORDER — POLYETHYLENE GLYCOL 3350 17 GM/SCOOP PO POWD: 1.0000 | Freq: Once | ORAL | Status: DC

## 2017-04-20 MED ORDER — CELECOXIB 200 MG PO CAPS: 200.0000 mg | ORAL_CAPSULE | ORAL | Status: DC

## 2017-04-20 MED ORDER — LACTATED RINGERS IV SOLN
INTRAVENOUS | Status: DC
  Administered 2017-04-20 – 2017-04-21 (×2): via INTRAVENOUS
  Administered 2017-04-21: 1000 mL via INTRAVENOUS

## 2017-04-20 MED ORDER — CEFOTETAN DISODIUM-DEXTROSE 2-2.08 GM-%(50ML) IV SOLR: 2.0000 g | INTRAVENOUS | Status: DC

## 2017-04-20 MED ORDER — SUGAMMADEX SODIUM 200 MG/2ML IV SOLN
INTRAVENOUS | Status: DC | PRN
  Administered 2017-04-20: 200 mg via INTRAVENOUS

## 2017-04-20 MED ORDER — LACTATED RINGERS IR SOLN
Status: DC | PRN
  Administered 2017-04-20: 1000 mL

## 2017-04-20 MED ORDER — BUPIVACAINE LIPOSOME 1.3 % IJ SUSP
20.0000 mL | Freq: Once | INTRAMUSCULAR | Status: AC
Start: 2017-04-20 — End: 2017-04-20
  Administered 2017-04-20: 20 mL
  Filled 2017-04-20: qty 20

## 2017-04-20 MED ORDER — NEOMYCIN SULFATE 500 MG PO TABS: 1000.0000 mg | ORAL_TABLET | ORAL | Status: DC

## 2017-04-20 MED ORDER — IBUPROFEN 200 MG PO TABS
600.0000 mg | ORAL_TABLET | Freq: Four times a day (QID) | ORAL | Status: DC
  Administered 2017-04-20 – 2017-04-23 (×12): 600 mg via ORAL
  Filled 2017-04-20 (×12): qty 3

## 2017-04-20 MED ORDER — DEXAMETHASONE SODIUM PHOSPHATE 10 MG/ML IJ SOLN
INTRAMUSCULAR | Status: DC | PRN
  Administered 2017-04-20: 10 mg via INTRAVENOUS

## 2017-04-20 MED ORDER — ALUM & MAG HYDROXIDE-SIMETH 200-200-20 MG/5ML PO SUSP
30.0000 mL | Freq: Four times a day (QID) | ORAL | Status: DC | PRN
  Administered 2017-04-21: 30 mL via ORAL
  Filled 2017-04-20: qty 30

## 2017-04-20 MED ORDER — ENSURE SURGERY PO LIQD
237.0000 mL | Freq: Two times a day (BID) | ORAL | Status: DC
  Administered 2017-04-20 – 2017-04-23 (×6): 237 mL via ORAL
  Filled 2017-04-20 (×7): qty 237

## 2017-04-20 MED ORDER — GLYCOPYRROLATE 0.2 MG/ML IV SOSY
PREFILLED_SYRINGE | INTRAVENOUS | Status: AC
  Filled 2017-04-20: qty 5

## 2017-04-20 MED ORDER — MEPERIDINE HCL 50 MG/ML IJ SOLN: 6.2500 mg | INTRAMUSCULAR | Status: DC | PRN

## 2017-04-20 MED ORDER — PROMETHAZINE HCL 25 MG/ML IJ SOLN: 6.2500 mg | INTRAMUSCULAR | Status: DC | PRN

## 2017-04-20 MED ORDER — HEPARIN SODIUM (PORCINE) 5000 UNIT/ML IJ SOLN
5000.0000 [IU] | Freq: Three times a day (TID) | INTRAMUSCULAR | Status: DC
  Administered 2017-04-20 – 2017-04-23 (×7): 5000 [IU] via SUBCUTANEOUS
  Filled 2017-04-20 (×8): qty 1

## 2017-04-20 MED ORDER — SODIUM CHLORIDE 0.9 % IJ SOLN
INTRAMUSCULAR | Status: DC | PRN
  Administered 2017-04-20: 15 mL

## 2017-04-20 MED ORDER — SUGAMMADEX SODIUM 200 MG/2ML IV SOLN
INTRAVENOUS | Status: AC
  Filled 2017-04-20: qty 2

## 2017-04-20 SURGICAL SUPPLY — 40 items
ADH SKN CLS APL DERMABOND .7 (GAUZE/BANDAGES/DRESSINGS) ×2
BLADE CLIPPER SURG (BLADE) IMPLANT
CHLORAPREP W/TINT 26ML (MISCELLANEOUS) ×2 IMPLANT
DECANTER SPIKE VIAL GLASS SM (MISCELLANEOUS) ×2 IMPLANT
DERMABOND ADVANCED (GAUZE/BANDAGES/DRESSINGS) ×2
DERMABOND ADVANCED .7 DNX12 (GAUZE/BANDAGES/DRESSINGS) ×2 IMPLANT
DRSG OPSITE POSTOP 4X8 (GAUZE/BANDAGES/DRESSINGS) ×2 IMPLANT
ELECT REM PT RETURN 15FT ADLT (MISCELLANEOUS) ×2 IMPLANT
GAUZE SPONGE 4X4 12PLY STRL (GAUZE/BANDAGES/DRESSINGS) ×2 IMPLANT
GLOVE BIO SURGEON STRL SZ7.5 (GLOVE) ×4 IMPLANT
GLOVE ECLIPSE 8.0 STRL XLNG CF (GLOVE) ×4 IMPLANT
GOWN STRL REUS W/TWL XL LVL3 (GOWN DISPOSABLE) ×12 IMPLANT
PACK COLON (CUSTOM PROCEDURE TRAY) ×2 IMPLANT
PAD ARMBOARD 7.5X6 YLW CONV (MISCELLANEOUS) ×4 IMPLANT
PAD POSITIONING PINK XL (MISCELLANEOUS) ×2 IMPLANT
PORT LAP GEL ALEXIS MED 5-9CM (MISCELLANEOUS) ×2 IMPLANT
RELOAD PROXIMATE 75MM BLUE (ENDOMECHANICALS) ×4 IMPLANT
SCISSORS LAP 5X35 DISP (ENDOMECHANICALS) ×2 IMPLANT
SEALER TISSUE G2 STRG ARTC 35C (ENDOMECHANICALS) ×2 IMPLANT
SET IRRIG TUBING LAPAROSCOPIC (IRRIGATION / IRRIGATOR) ×2 IMPLANT
SHEARS HARMONIC ACE PLUS 36CM (ENDOMECHANICALS) ×2 IMPLANT
SLEEVE ADV FIXATION 5X100MM (TROCAR) ×4 IMPLANT
SLEEVE ENDOPATH XCEL 5M (ENDOMECHANICALS) ×2 IMPLANT
SPONGE LAP 18X18 X RAY DECT (DISPOSABLE) ×2 IMPLANT
STAPLER GUN LINEAR PROX 60 (STAPLE) ×2 IMPLANT
STAPLER PROXIMATE 75MM BLUE (STAPLE) ×2 IMPLANT
SUT MNCRL AB 4-0 PS2 18 (SUTURE) ×4 IMPLANT
SUT PDS AB 1 TP1 96 (SUTURE) ×6 IMPLANT
SUT PROLENE 2 0 CT2 30 (SUTURE) IMPLANT
SUT PROLENE 2 0 KS (SUTURE) IMPLANT
SUT SILK 2 0 SH CR/8 (SUTURE) ×2 IMPLANT
SUT SILK 3 0 SH CR/8 (SUTURE) ×2 IMPLANT
TOWEL OR 17X24 6PK STRL BLUE (TOWEL DISPOSABLE) ×2 IMPLANT
TOWEL OR 17X26 10 PK STRL BLUE (TOWEL DISPOSABLE) ×2 IMPLANT
TRAY FOLEY W/METER SILVER 16FR (SET/KITS/TRAYS/PACK) ×2 IMPLANT
TROCAR ADV FIXATION 5X100MM (TROCAR) ×2 IMPLANT
TROCAR BALLN 12MMX100 BLUNT (TROCAR) ×2 IMPLANT
TUBING CONNECTING 10 (TUBING) ×4 IMPLANT
TUBING INSUF HEATED (TUBING) ×2 IMPLANT
YANKAUER SUCT BULB TIP NO VENT (SUCTIONS) ×2 IMPLANT

## 2017-04-20 NOTE — Anesthesia Postprocedure Evaluation (Signed)
Anesthesia Post Note  Patient: CLAXTON LEVITZ  Procedure(s) Performed: LAPAROSCOPIC VS OPEN ILEOCECTOMY ERAS PATHWAY (Right Abdomen)     Patient location during evaluation: PACU Anesthesia Type: General Level of consciousness: awake and alert and oriented Pain management: pain level controlled Vital Signs Assessment: post-procedure vital signs reviewed and stable Respiratory status: spontaneous breathing, nonlabored ventilation, respiratory function stable and patient connected to nasal cannula oxygen Cardiovascular status: blood pressure returned to baseline and stable Postop Assessment: no apparent nausea or vomiting Anesthetic complications: no    Last Vitals:  Vitals:   04/20/17 1530 04/20/17 1548  BP: (!) 134/96 127/84  Pulse: 73 76  Resp: 15 16  Temp: 37.2 C 37 C  SpO2: 99% 98%    Last Pain:  Vitals:   04/20/17 1530  TempSrc:   PainSc: 4                  Eston Heslin A.

## 2017-04-20 NOTE — Anesthesia Procedure Notes (Signed)
Procedure Name: Intubation Date/Time: 04/20/2017 11:30 AM Performed by: Victoriano Lain, CRNA Pre-anesthesia Checklist: Patient identified, Emergency Drugs available, Suction available, Patient being monitored and Timeout performed Patient Re-evaluated:Patient Re-evaluated prior to induction Oxygen Delivery Method: Circle system utilized Preoxygenation: Pre-oxygenation with 100% oxygen Induction Type: IV induction Ventilation: Mask ventilation without difficulty and Oral airway inserted - appropriate to patient size Laryngoscope Size: Mac and 4 Grade View: Grade II Tube type: Oral Tube size: 7.5 mm Number of attempts: 2 Airway Equipment and Method: Stylet Placement Confirmation: ETT inserted through vocal cords under direct vision,  positive ETCO2 and breath sounds checked- equal and bilateral Secured at: 22 cm Tube secured with: Tape Dental Injury: Teeth and Oropharynx as per pre-operative assessment  Comments: Easy mask with oral airway. DL X 1 with MAC 4 by CRNA. Grade 3 view. Easy mask. Head repositioned by Dr. Smith Robert. DL X 1 by Dr. Smith Robert with Mac 4. Grade 2-3 view. + ETCO2. ATOI. BBS=. ETT secured at 22 at the lip.

## 2017-04-20 NOTE — Transfer of Care (Signed)
Immediate Anesthesia Transfer of Care Note  Patient: [name] [name]  Procedure(s) Performed: LAPAROSCOPIC VS OPEN ILEOCECTOMY ERAS PATHWAY (Right Abdomen)  Patient Location: PACU  Anesthesia Type:General  Level of Consciousness: sedated  Airway & Oxygen Therapy: Patient Spontanous Breathing and Patient connected to face mask oxygen  Post-op Assessment: Report given to RN and Post -op Vital signs reviewed and stable  Post vital signs: Reviewed and stable  Last Vitals:  Vitals Value Taken Time  BP 149/109 04/20/2017  2:33 PM  Temp    Pulse 79 04/20/2017  2:35 PM  Resp 14 04/20/2017  2:35 PM  SpO2 100 % 04/20/2017  2:35 PM  Vitals shown include unvalidated device data.  Last Pain:  Vitals:   04/20/17 1026  TempSrc:   PainSc: 0-No pain         Complications: No apparent anesthesia complications

## 2017-04-20 NOTE — Op Note (Signed)
PATIENT: [name] [name]  52 y.o. 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 Dema Severin, MD  ASSISTANT: Coralie Keens, MD  ANESTHESIA: General endotracheal  EBL: 50cc Total I/O In: 54 [I.V.:1900] Out: 160 [Urine:110; Blood:50]  DRAINS: None  SPECIMEN: Ileocolic resection - terminal ileum, cecum+appendix  COUNTS: Sponge, needle and instrument counts were reported correct x2  FINDINGS: Short, tethered ascending colon. Laparoscopic ileocecectomy - specimen opened on back table and ileocecal valve polyp visualized. Stapled side-to-side functional end-to-end ileocolic anastomosis located.  STATEMENT OF MEDICAL NECESSITY: [name] [name] is a very pleasant 76yoM here for evaluation of endoscopically unresectable polyp at the appendiceal orifice and ileocecal valve. He is here today with his wife. He underwent a colonoscopy 02/21/17 with Dr. Loletha Carrow for surveillance. He has a history of rectal bleeding for which she underwent 2 other colonoscopies for-the only one we have record of being 09/2013 with Dr. Olevia Perches. Colonoscopy 09/2013 - 4 sessile polyps were found at the ileocecal valve-carpeted to similar polyp previously removed on last colonoscopy now extending over most ileocecal valve, biopsies taken, second polyp ascending colon 4 mm removed with cold biopsy, third polyp next to it 3 mm, not removed due to difficult location behind a fold, fourth polyp in the rectum 7 mm removed with a cold snare, fifth polyp-rectal removed with biopsies. Pathology showed tubular adenomas without high-grade dysplasia. Cecal polyp showed hyperplastic polyps without evidence of malignancy. Rectal polyps-tubular adenomas without high-grade dysplasia  Colonoscopy with Dr. Loletha Carrow 01/2017- 1. 8 mm polyp adjacent  to the appendiceal orifice-multilobulated, not removed 2. Large polyp at ileocecal valve, carpet-like, multilobulated-biopsy-returned sessile serrated polyp 3. 4 mm polyp at the hepatic flexure-piecemeal with forceps-pathology lymphoid aggregate 4. 10 mm polyp in the transverse colon-hyperplastic polyp - removed 5. 5mm polyp in the rectum removed-tubular adenoma 6. Diverticulosis in the right colon 7. Internal hemorrhoids  All of this was discussed with the patient. The recommendation for a right hemicolectomy was made. He was concerned about removing additional colon besides the portion containing the pathology. We discussed the rational and potential for cancer within the polyp. We discussed the reasoning behind why an ileocecectomy is an inadequate operation for malignancy and the sampling error involved in performing polyp biopsies.   We discussed that should he undergo an ileocecectomy and have pathology demonstrate cancer, he would likely need a repeat operation as the lymph nodes would be inadequate and number to properly stage and clear the draining basins of possible cancer and he may additionally be recommended chemotherapy if he declines further surgery. He acknowledges this risk and the potential need for chemotherapy/surgery as well as an inadequate resection. He has elected to pursue ileocecectomy. We discussed the postoperative functional expectations from either a Right hemicolectomy or ileocecectomy and the postoperative outcomes are typically very similar. Pictures and illustrations demonstrating all the above and lymph node drainage basins in an attempt to further educate him on his decision  The anatomy and physiology of the GI tract was discussed at length with the patient and his wife. The pathophysiology of polyps, possibility for cancer and treatment approach was discussed at length with associated pictures and illustrations.  The planned procedure, material risks  (including, but not limited to, pain, bleeding, infection, scarring, need for blood transfusion, damage to surrounding structures- blood vessels/nerves/viscus/organs, leak from anastomosis, need for additional procedures, need  for stoma which may be permanent, hernia, recurrence, pneumonia, heart attack, stroke, death) benefits and alternatives to surgery were discussed. His questions were answered. He voiced understanding and has elected to proceed with surgery.  NARRATIVE:  The patient was identified & brought into the operating room, placed supine on the operating table and SCDs were applied to the lower extremities. General endotracheal anesthesia was induced. The patient was positioned supine with arms tucked. Antibiotics were administered. A foley catheter was placed under sterile conditions. The abdomen was prepped and draped in a sterile fashion. A timeout was performed confirming our patient and plan.  An infraumbilical incision was made and dissection carried down to the umbilical stalk. This was grasped with a kocher and retracted outwardly. The midline infraumbilical fascia was scored with cautery. Blunt dissection was used to gain peritoneal entry. A Hasson trocar was placed. The abdomen was insufflated to 15 mmHg with CO2.  Camera inspection revealed no evidence of injury. Additional ports were then placed under direct laparoscopic visualization. The abdomen was surveyed. The liver and peritoneum appeared normal.  There were no signs of metastatic disease.  Bilateral TAP blocks were performed under laparoscopic visualization using dilute Exparel+Marcaine with epi.  The patient was positioned in trendelenburg with left side down. The bowel was swept from the pelvis and the omentum reflected cephalad. The terminal ileum and its retroperitoneal attachments were gently freed staying within the avascular plane as it was reflected medially. The cecum and ascending colon were mobilized along the [place]y Seamans  line of [place]. The colon was reflected medially. After medialization of the cecum and ascending colon, attention was turned to ligation of the ileocolic mesentery. The ileocolic pedicle was identified. Staying somewhat distal on the vessel (as opposed to a true high ligation), a window was developed in the mesentery. This segment of mesentery was then ligated using an EnSeal device. At this point, the cecum and terminal ileum reached past the midline.  Attention was turned to the extracorporeal portion of the procedure. The umbilical incision was extended cephalad. The pneumoperitoneum was released and the umbilical trocar removed. An [place] wound protector was placed. The colon and terminal ileum were delivered into the wound. On inspection, the ascending colon was quite short and there was not adequate reach to deliver the appropriate amount of bowel into the field to safely perform the anastomosis. The yellow cap for the Ubaldo Glassing was placed and pneumoperitoneum re-established. The proximal portion of the ascending colon was mobilized as was part of the hepatic flexure to facilitate reach. The cap was then removed and the bowel delivered back onto the field.  The field was draped with additional sterile towels. The terminal ileum was identified and a window created in the mesentery. The terminal ileum was divided approximately 10cm proximal to the ileocecal valve using a GIA 65mm blue load. The distal point of transection was identified at a zone of well perfused ascending colon. A window was made in the mesentery and this segment of bowel was then divided with a GIA 46mm blue load. The intervening mesentery was ligated with the EnSeal device. The mesentery and ileocolic pedicle were inspected and noted to be hemostatic.  Attention was turned to performing the anastomosis. A stay suture was placed distally. The antimesenteric corners of the ileal and colonic staple lines were removed and a side to side,  functional end to end ileo-colonic anastomosis was fashioned with a GIA 67mm blue load. The staple line was inspected and noted to be hemostatic. The  common enterotomy was then approximated with Allis clamps and closed with a TA 20mm blue load. This staple line had one spot of oozing controlled with a 3-0 silk U-stitch. Reinspection revealed a hemostatic staple line. A 2-0 silk "crotch" stitch was placed. The anastomosis was inspected and noted to be tension free, healthy in appearance and widely patent.  The abdomen was irrigated with 2L of warm sterile saline. Hemostasis verified. The wound protector was removed and attention turned to closing. Counts were taken and correct. Gowns/gloves of all participants were changed. The field was draped out with additional sterile drapes. A separate sterile instrument set was then used for closing. The fascia was approximated with running #1 looped PDS. The skin of all incision sites was closed with 4-0 monocryl subcuticular suture. The wounds were dressed with Dermabond and a Honeycomb dressing applied to the midline incision. The patient was then awakened from anesthesia, extubated and transferred to a stretcher for transport to PACU in satisfactory condition.  DISPOSITION: PACU, hemodynamically stable

## 2017-04-20 NOTE — H&P (Signed)
H&P from 04/11/17 reviewed by myself with the patient and he reported no interval changes to his history.  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  OR today for laparoscopic versus open ileocecectomy  Originally scheduled for a lap versus open right hemicolectomy, however, has thought about things and wishes to pursue a ileocecectomy. We discussed the recommendation for a Right hemicolectomy, namely, the risk that this polyp is harboring cancer, in which case an ileocecectomy would be an inadequate operation. We discussed that should he undergo an ileocecectomy and have pathology demonstrate cancer, he would likely need adjuvant chemotherapy or repeat operation as the lymph nodes would be inadequate and number to properly stage and clear the draining basins of possible cancer. He acknowledges this risk and the potential need for chemotherapy/surgery as well as an inadequate resection. He has elected to pursue ileocecectomy. We discussed the postoperative functional expectations from either a Right hemicolectomy or ileocecectomy and the postoperative outcomes are often very similar. These pictures and illustrations demonstrating all the above and lymph node drainage basins in an attempt to further educate him on his decision  The anatomy and physiology of the GI tract was discussed at length with the patient and his wife. The pathophysiology of polyps, possibility for cancer and treatment approach was discussed at length with associated pictures and illustrations.  The planned procedure, material risks (including, but not limited to, pain, bleeding, infection, scarring, need for blood  transfusion, damage to surrounding structures- blood vessels/nerves/viscus/organs, leak from anastomosis, need for additional procedures, need for stoma which may be permanent, hernia, recurrence, pneumonia, heart attack, stroke, death) benefits and alternatives to surgery were discussed. His questions were answered. He voiced understanding and has elected to proceed with surgery.  Sharon Mt. Dema Severin, M.D. General and Colorectal Surgery [facility]

## 2017-04-20 NOTE — Brief Op Note (Signed)
04/20/2017  2:30 PM  PATIENT:  [name] [name]  52 y.o. 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)  POST-OPERATIVE DIAGNOSIS: Same  PROCEDURE:  Procedure(s): LAPAROSCOPIC ILEOCECTOMY ERAS PATHWAY  SURGEON:  Surgeon(s) and Role:    * Lashauna Arpin, Sharon Mt, MD - Primary    * Coralie Keens, MD - Assisting  ASSISTANTS: Coralie Keens, MD   ANESTHESIA:   general  EBL:  50 mL   BLOOD ADMINISTERED:none  DRAINS: none   LOCAL MEDICATIONS USED:  Exparel + marcaine with epinepherine  SPECIMEN: Terminal ileum, cecum with appendix  DISPOSITION OF SPECIMEN:  PATHOLOGY  COUNTS:  YES  TOURNIQUET:  * No tourniquets in log *  DICTATION: .Dragon Dictation  PLAN OF CARE: Admit to inpatient   PATIENT DISPOSITION:  PACU - hemodynamically stable.   Delay start of Pharmacological VTE agent (>24hrs) due to surgical blood loss or risk of bleeding: no   Full op note will be dictated using Sales executive.  Sharon Mt. Dema Severin, M.D. General and Colorectal Surgery [facility]

## 2017-04-20 NOTE — Anesthesia Preprocedure Evaluation (Addendum)
Anesthesia Evaluation  Patient identified by MRN, date of birth, ID band Patient awake    Reviewed: Allergy & Precautions, NPO status , Patient's Chart, lab work & pertinent test results  Airway Mallampati: III  TM Distance: >3 FB Neck ROM: Full    Dental  (+) Teeth Intact, Dental Advisory Given   Pulmonary asthma , Current Smoker,    breath sounds clear to auscultation       Cardiovascular hypertension, Pt. on home beta blockers + dysrhythmias  Rhythm:Regular Rate:Normal     Neuro/Psych  Headaches, negative psych ROS   GI/Hepatic Neg liver ROS, GERD  Medicated,  Endo/Other  negative endocrine ROS  Renal/GU negative Renal ROS     Musculoskeletal negative musculoskeletal ROS (+)   Abdominal Normal abdominal exam  (+)   Peds  Hematology negative hematology ROS (+)   Anesthesia Other Findings   Reproductive/Obstetrics                            Lab Results  Component Value Date   WBC 8.8 04/20/2017   HGB 17.3 (H) 04/20/2017   HCT 49.3 04/20/2017   MCV 93.9 04/20/2017   PLT 268 04/20/2017   Lab Results  Component Value Date   CREATININE 0.83 04/14/2017   BUN 17 04/14/2017   NA 141 04/14/2017   K 5.2 (H) 04/14/2017   CL 103 04/14/2017   CO2 28 04/14/2017   Repeating labs.   Anesthesia Physical Anesthesia Plan  ASA: II  Anesthesia Plan: General   Post-op Pain Management:    Induction: Intravenous  PONV Risk Score and Plan: 2 and Dexamethasone and Ondansetron  Airway Management Planned: Oral ETT  Additional Equipment: None  Intra-op Plan:   Post-operative Plan: Extubation in OR  Informed Consent: I have reviewed the patients History and Physical, chart, labs and discussed the procedure including the risks, benefits and alternatives for the proposed anesthesia with the patient or authorized representative who has indicated his/her understanding and acceptance.   Dental  advisory given  Plan Discussed with: CRNA  Anesthesia Plan Comments:        Anesthesia Quick Evaluation

## 2017-04-21 ENCOUNTER — Other Ambulatory Visit: Payer: Self-pay

## 2017-04-21 ENCOUNTER — Encounter (HOSPITAL_COMMUNITY): Payer: Self-pay | Admitting: Surgery

## 2017-04-21 LAB — BASIC METABOLIC PANEL
Anion gap: 11 (ref 5–15)
BUN: 14 mg/dL (ref 6–20)
CHLORIDE: 105 mmol/L (ref 101–111)
CO2: 21 mmol/L — ABNORMAL LOW (ref 22–32)
CREATININE: 0.76 mg/dL (ref 0.61–1.24)
Calcium: 8.8 mg/dL — ABNORMAL LOW (ref 8.9–10.3)
GFR calc Af Amer: >60 mL/min (ref >60)
GFR calc non Af Amer: >60 mL/min (ref >60)
Glucose, Bld: 115 mg/dL — ABNORMAL HIGH (ref 65–99)
POTASSIUM: 4.1 mmol/L (ref 3.5–5.1)
SODIUM: 137 mmol/L (ref 135–145)

## 2017-04-21 LAB — CBC
HEMATOCRIT: 42.8 % (ref 39.0–52.0)
Hemoglobin: 14.3 g/dL (ref 13.0–17.0)
MCH: 32 pg (ref 26.0–34.0)
MCHC: 33.4 g/dL (ref 30.0–36.0)
MCV: 95.7 fL (ref 78.0–100.0)
PLATELETS: 219 10*3/uL (ref 150–400)
RBC: 4.47 MIL/uL (ref 4.22–5.81)
RDW: 13 % (ref 11.5–15.5)
WBC: 13.1 10*3/uL — AB (ref 4.0–10.5)

## 2017-04-21 LAB — PHOSPHORUS: PHOSPHORUS: 3.7 mg/dL (ref 2.5–4.6)

## 2017-04-21 LAB — MAGNESIUM: Magnesium: 1.6 mg/dL — ABNORMAL LOW (ref 1.7–2.4)

## 2017-04-21 MED ORDER — MAGNESIUM SULFATE 4 GM/100ML IV SOLN
4.0000 g | Freq: Once | INTRAVENOUS | Status: AC
  Administered 2017-04-21: 4 g via INTRAVENOUS
  Filled 2017-04-21: qty 100

## 2017-04-21 NOTE — Progress Notes (Addendum)
Subjective No acute events. Occasional sharp pain but controlled. Had BM last night and again this morning. Pain well controlled on ibuprofen and tylenol. Ambulating around unit last night and this morning.  Objective: Vital signs in last 24 hours: Temp:  [97.8 F (36.6 C)-99 F (37.2 C)] 98.9 F (37.2 C) (03/28 0615) Pulse Rate:  [58-82] 61 (03/28 0615) Resp:  [13-20] 18 (03/28 0615) BP: (111-161)/(72-113) 137/86 (03/28 0615) SpO2:  [96 %-100 %] 99 % (03/28 0615) Weight:  [93.9 kg (207 lb)] 93.9 kg (207 lb) (03/27 1026) Last BM Date: 04/20/17  Intake/Output from previous day: 03/27 0701 - 03/28 0700 In: 3647.5 [P.O.:690; I.V.:2957.5] Out: 610 [Urine:560; Blood:50] Intake/Output this shift: Total I/O In: 1350 [P.O.:450; I.V.:900] Out: 450 [Urine:450]  Gen: NAD, comfortable CV: RRR Pulm: Normal work of breathing Abd: Soft, nontender; mildly distended; no rebound/guarding; incisions c/d/i without erythema. Ext: SCDs in place  Lab Results: CBC  Recent Labs    04/20/17 1030 04/21/17 0420  WBC 8.8 13.1*  HGB 17.3* 14.3  HCT 49.3 42.8  PLT 268 219   BMET Recent Labs    04/21/17 0420  NA 137  K 4.1  CL 105  CO2 21*  GLUCOSE 115*  BUN 14  CREATININE 0.76  CALCIUM 8.8*   PT/INR Recent Labs    04/20/17 1030  LABPROT 12.7  INR 0.96    Anti-infectives: Anti-infectives (From admission, onward)   Start     Dose/Rate Route Frequency Ordered Stop   04/20/17 1006  cefoTEtan in Dextrose 5% (CEFOTAN) IVPB 2 g  Status:  Discontinued     2 g Intravenous On call to O.R. 04/20/17 1006 04/20/17 1013   04/20/17 1006  neomycin (MYCIFRADIN) tablet 1,000 mg  Status:  Discontinued     1,000 mg Oral 3 times per day 04/20/17 1006 04/20/17 1014   04/20/17 1006  metroNIDAZOLE (FLAGYL) tablet 1,000 mg  Status:  Discontinued     1,000 mg Oral 3 times per day 04/20/17 1006 04/20/17 1015   04/20/17 1006  cefoTEtan in Dextrose 5% (CEFOTAN) IVPB 2 g     2 g Intravenous On call to  O.R. 04/20/17 1006 04/20/17 1202       Assessment/Plan: Patient Active Problem List   Diagnosis Date Noted  . Colon polyp 04/20/2017  . H/O wheezing 03/15/2017  . Nonsustained paroxysmal supraventricular tachycardia ([place]) 07/13/2013  . Chest pain 11/22/2012  . Tobacco abuse 11/22/2012  . GERD 12/23/2009   [name] [name] is a 15yoM hx endoscopically unresectable polyp at ICV s/p lap ileocecectomy 3/27  -Advanced to full liquids -Ibuprofen/tylenol; PRN tramadol and dilaudid -4g IV mag sulfate today for hypomagnesemia -Continue MIVF until tolerating fulls reliably -D/C entereg - 2 BMs -Ambulate 5x/day -PPx: SQH, SCDs, PPI   LOS: 1 day   [name] [name], M.D. General and Colorectal Surgery [facility]

## 2017-04-22 LAB — CBC
HCT: 43.5 % (ref 39.0–52.0)
HEMOGLOBIN: 14.8 g/dL (ref 13.0–17.0)
MCH: 32.6 pg (ref 26.0–34.0)
MCHC: 34 g/dL (ref 30.0–36.0)
MCV: 95.8 fL (ref 78.0–100.0)
Platelets: 212 10*3/uL (ref 150–400)
RBC: 4.54 MIL/uL (ref 4.22–5.81)
RDW: 13.1 % (ref 11.5–15.5)
WBC: 11.8 10*3/uL — ABNORMAL HIGH (ref 4.0–10.5)

## 2017-04-22 LAB — BASIC METABOLIC PANEL
Anion gap: 9 (ref 5–15)
BUN: 10 mg/dL (ref 6–20)
CHLORIDE: 106 mmol/L (ref 101–111)
CO2: 23 mmol/L (ref 22–32)
CREATININE: 0.79 mg/dL (ref 0.61–1.24)
Calcium: 8.5 mg/dL — ABNORMAL LOW (ref 8.9–10.3)
GFR calc Af Amer: >60 mL/min (ref >60)
GFR calc non Af Amer: >60 mL/min (ref >60)
GLUCOSE: 105 mg/dL — AB (ref 65–99)
POTASSIUM: 4.3 mmol/L (ref 3.5–5.1)
SODIUM: 138 mmol/L (ref 135–145)

## 2017-04-22 LAB — PHOSPHORUS: Phosphorus: 2.6 mg/dL (ref 2.5–4.6)

## 2017-04-22 LAB — MAGNESIUM: MAGNESIUM: 2 mg/dL (ref 1.7–2.4)

## 2017-04-22 MED ORDER — TRAMADOL HCL 50 MG PO TABS: 50.0000 mg | ORAL_TABLET | Freq: Four times a day (QID) | ORAL | 0 refills | Status: AC | PRN

## 2017-04-22 NOTE — Progress Notes (Signed)
Subjective No acute events. Abdominal discomfort much improved this morning relative to yesterday afternoon. Has had multiple loose BMs overnight. Distention improved. Denies n/v on full liquids. Ambulating. Pain controlled on tylenol and ibuprofen.  Objective: Vital signs in last 24 hours: Temp:  [97.5 F (36.4 C)-99.8 F (37.7 C)] 97.5 F (36.4 C) (03/29 0415) Pulse Rate:  [58-82] 67 (03/29 0415) Resp:  [16-18] 18 (03/29 0415) BP: (120-149)/(76-93) 149/93 (03/29 0415) SpO2:  [97 %-99 %] 97 % (03/29 0415) Weight:  [93.7 kg (206 lb 9.1 oz)-99.9 kg (220 lb 3.8 oz)] 99.9 kg (220 lb 3.8 oz) (03/29 0415) Last BM Date: 04/21/17  Intake/Output from previous day: 03/28 0701 - 03/29 0700 In: 2820 [P.O.:1020; I.V.:1800] Out: -  Intake/Output this shift: Total I/O In: 1410 [P.O.:360; I.V.:1050] Out: -   Gen: NAD, comfortable CV: RRR Pulm: Normal work of breathing Abd: Soft, nontender; mildly distended; no rebound/guarding; incisions c/d/i without erythema. Honeycomb in place Ext: SCDs in place  Lab Results: CBC  Recent Labs    04/21/17 0420 04/22/17 0404  WBC 13.1* 11.8*  HGB 14.3 14.8  HCT 42.8 43.5  PLT 219 212   BMET Recent Labs    04/21/17 0420 04/22/17 0404  NA 137 138  K 4.1 4.3  CL 105 106  CO2 21* 23  GLUCOSE 115* 105*  BUN 14 10  CREATININE 0.76 0.79  CALCIUM 8.8* 8.5*   PT/INR Recent Labs    04/20/17 1030  LABPROT 12.7  INR 0.96    Anti-infectives: Anti-infectives (From admission, onward)   Start     Dose/Rate Route Frequency Ordered Stop   04/20/17 1006  cefoTEtan in Dextrose 5% (CEFOTAN) IVPB 2 g  Status:  Discontinued     2 g Intravenous On call to O.R. 04/20/17 1006 04/20/17 1013   04/20/17 1006  neomycin (MYCIFRADIN) tablet 1,000 mg  Status:  Discontinued     1,000 mg Oral 3 times per day 04/20/17 1006 04/20/17 1014   04/20/17 1006  metroNIDAZOLE (FLAGYL) tablet 1,000 mg  Status:  Discontinued     1,000 mg Oral 3 times per day 04/20/17 1006  04/20/17 1015   04/20/17 1006  cefoTEtan in Dextrose 5% (CEFOTAN) IVPB 2 g     2 g Intravenous On call to O.R. 04/20/17 1006 04/20/17 1202       Assessment/Plan: Patient Active Problem List   Diagnosis Date Noted  . Colon polyp 04/20/2017  . H/O wheezing 03/15/2017  . Nonsustained paroxysmal supraventricular tachycardia ([place]) 07/13/2013  . Chest pain 11/22/2012  . Tobacco abuse 11/22/2012  . GERD 12/23/2009   [name] is a 36yoM hx endoscopically unresectable polyp at ICV s/p lap ileocecectomy 3/27  -Advanced to soft diet -Ibuprofen/tylenol; PRN tramadol and dilaudid -Decrease MIVF -Ambulate 5x/day -PPx: SQH, SCDs, PPI -Dispo: Possible discharge tomorrow if doing well, tolerating diet, pain controlled   LOS: 2 days   Sharon Mt. Dema Severin, M.D. General and Colorectal Surgery [facility]

## 2017-04-22 NOTE — Discharge Instructions (Signed)
POST OP INSTRUCTIONS  1. DIET: Be sure to include lots of fluids daily.  Avoid raw/uncooked vegetables for the first 4 weeks after surgery. Fruits and vegetables are ok as long as they are soft enough they can be mashed on the roof of your mouth. Smoothies are also an acceptable way to consume these foods. Otherwise, diet as tolerated.  2. Take your usually prescribed home medications unless otherwise directed.  3. PAIN CONTROL: a. Pain is best controlled by a usual combination of three different methods TOGETHER: i. Ice/Heat ii. Over the counter pain medication iii. Prescription pain medication b. Most patients will experience some swelling and bruising around the surgical site.  Ice packs or heating pads (30-60 minutes up to 6 times a day) will help. Some people prefer to use ice alone, heat alone, alternating between ice & heat.  Experiment to what works for you.  Swelling and bruising can take several weeks to resolve.   c. It is helpful to take an over-the-counter pain medication regularly for the first week or two: i. Ibuprofen (Motrin/Advil) - 200mg  tabs - take 3 tabs (600mg ) every 6 hours as needed for pain ii. Acetaminophen (Tylenol) - you may take 650mg  every 6 hours as needed. You can take this with motrin as they act differently on the body. If you are taking a narcotic pain medication that has acetaminophen in it, do not take over the counter tylenol at the same time.  Iii. NOTE: You may take both of these medications together - most patients  find it most helpful when alternating between the two (i.e. Ibuprofen at 6am,  tylenol at 9am, ibuprofen at 12pm ...) d. A  prescription for pain medication should be given to you upon discharge.  Take your pain medication as prescribed if your pain is not adequatly controlled with the over-the-counter pain reliefs mentioned above.  4. Avoid getting constipated.  Between the surgery and the pain medications, it is common to experience some  constipation.  Increasing fluid intake and taking a fiber supplement (such as Metamucil, Citrucel, FiberCon, MiraLax, etc) 1-2 times a day regularly will usually help prevent this problem from occurring.  A mild laxative (prune juice, Milk of Magnesia, MiraLax, etc) should be taken according to package directions if there are no bowel movements after 48 hours.    5. Dressing: Your incision is covered in Dermabond which is like sterile superglue for the skin. This will come off on it's own in a couple weeks. It is waterproof and you may bathe normally starting the day after your surgery in a shower. Avoid baths/pools/lakes/oceans until your wounds have fully healed.  6. ACTIVITIES as tolerated:   a. Avoid heavy lifting (>10lbs or 1 gallon of milk) for the next 6 weeks. b. You may resume regular (light) daily activities beginning the next day--such as daily self-care, walking, climbing stairs--gradually increasing activities as tolerated.  If you can walk 30 minutes without difficulty, it is safe to try more intense activity such as jogging, treadmill, bicycling, low-impact aerobics.  c. DO NOT PUSH THROUGH PAIN.  Let pain be your guide: If it hurts to do something, don't do it. d. Dennis Bast may drive when you are no longer taking prescription pain medication, you can comfortably wear a seatbelt, and you can safely maneuver your car and apply brakes. e. Dennis Bast may have sexual intercourse when it is comfortable.   7. FOLLOW UP in our office a. Please call [facility] at [phone] to set up an  appointment to see your surgeon in the office for a follow-up appointment approximately 2 weeks after your surgery. b. Make sure that you call for this appointment the day you arrive home to insure a convenient appointment time.  9. If you have disability or family leave forms that need to be completed, you may have them completed by your primary care physician's office; for return to work instructions, please ask our office  staff and they will be happy to assist you in obtaining this documentation   When to call us [phone]: 1. Poor pain control 2. Reactions / problems with new medications (rash/itching, etc)  3. Fever over 101.5 F (38.5 C) 4. Inability to urinate 5. Nausea/vomiting 6. Worsening swelling or bruising 7. Continued bleeding from incision. 8. Increased pain, redness, or drainage from the incision  The clinic staff is available to answer your questions during regular business hours (8:30am-5pm).  Please dont hesitate to call and ask to speak to one of our nurses for clinical concerns.   A surgeon from [facility] is always on call at the hospitals   If you have a medical emergency, go to the nearest emergency room or call 911.  [facility], [place] [address] [place], [place], [place]  [address] MAIN: [phone] FAX: [phone] [email or web address]

## 2017-04-23 LAB — CBC
HCT: 41.5 % (ref 39.0–52.0)
Hemoglobin: 14.1 g/dL (ref 13.0–17.0)
MCH: 32.4 pg (ref 26.0–34.0)
MCHC: 34 g/dL (ref 30.0–36.0)
MCV: 95.4 fL (ref 78.0–100.0)
Platelets: 212 10*3/uL (ref 150–400)
RBC: 4.35 MIL/uL (ref 4.22–5.81)
RDW: 12.8 % (ref 11.5–15.5)
WBC: 12.6 10*3/uL — AB (ref 4.0–10.5)

## 2017-04-23 LAB — BASIC METABOLIC PANEL
Anion gap: 9 (ref 5–15)
BUN: 13 mg/dL (ref 6–20)
CHLORIDE: 106 mmol/L (ref 101–111)
CO2: 24 mmol/L (ref 22–32)
CREATININE: 0.88 mg/dL (ref 0.61–1.24)
Calcium: 8.7 mg/dL — ABNORMAL LOW (ref 8.9–10.3)
GFR calc non Af Amer: >60 mL/min (ref >60)
Glucose, Bld: 105 mg/dL — ABNORMAL HIGH (ref 65–99)
Potassium: 4.2 mmol/L (ref 3.5–5.1)
Sodium: 139 mmol/L (ref 135–145)

## 2017-04-23 LAB — MAGNESIUM: Magnesium: 1.9 mg/dL (ref 1.7–2.4)

## 2017-04-23 LAB — PHOSPHORUS: Phosphorus: 3.1 mg/dL (ref 2.5–4.6)

## 2017-04-23 MED ORDER — HYDROCODONE-ACETAMINOPHEN 5-325 MG PO TABS: 1.0000 | ORAL_TABLET | Freq: Four times a day (QID) | ORAL | 0 refills | Status: DC | PRN

## 2017-04-23 NOTE — Progress Notes (Signed)
Disharge instructions and follow-up information reviewed with patient and wife; both verbalize understanding.  Patient taken by wheelchair to personal vehicle by NT.

## 2017-04-23 NOTE — Progress Notes (Signed)
[facility] Office:  [phone] General Surgery Progress Note   LOS: 3 days  POD -  3 Days Post-Op  Chief Complaint: Colon polyp  Assessment and Plan: 1.  LAPAROSCOPIC assisted ILEOCECTOMY - 04/20/2017 - [facility] - Sessile serrated polyp, 0/13 nodes  2.  Smokes 3.  GERD 4.  DVT prophylaxis - SQ Heparin   Active Problems:   Colon polyp  Subjective:  Having BM's.  Taking po's okay.  No complaint.  Objective:   Vitals:   04/23/17 0433 04/23/17 0843  BP: (!) 137/93 (!) 145/86  Pulse: 78 81  Resp: 18   Temp: 98.2 F (36.8 C)   SpO2: 97%      Intake/Output from previous day:  03/29 0701 - 03/30 0700 In: 1500 [P.O.:600; I.V.:900] Out: -   Intake/Output this shift:  No intake/output data recorded.   Physical Exam:   General: WN AM who is alert and oriented.    HEENT: Normal. Pupils equal. .   Lungs: Clear   Abdomen: mild distention, has BS   Wound: Clean   Lab Results:    Recent Labs    04/22/17 0404 04/23/17 0429  WBC 11.8* 12.6*  HGB 14.8 14.1  HCT 43.5 41.5  PLT 212 212    BMET   Recent Labs    04/22/17 0404 04/23/17 0429  NA 138 139  K 4.3 4.2  CL 106 106  CO2 23 24  GLUCOSE 105* 105*  BUN 10 13  CREATININE 0.79 0.88  CALCIUM 8.5* 8.7*    PT/INR   Recent Labs    04/20/17 1030  LABPROT 12.7  INR 0.96    ABG  No results for input(s): PHART, HCO3 in the last 72 hours.  Invalid input(s): PCO2, PO2   Studies/Results:  No results found.   Anti-infectives:   Anti-infectives (From admission, onward)   Start     Dose/Rate Route Frequency Ordered Stop   04/20/17 1006  cefoTEtan in Dextrose 5% (CEFOTAN) IVPB 2 g  Status:  Discontinued     2 g Intravenous On call to O.R. 04/20/17 1006 04/20/17 1013   04/20/17 1006  neomycin (MYCIFRADIN) tablet 1,000 mg  Status:  Discontinued     1,000 mg Oral 3 times per day 04/20/17 1006 04/20/17 1014   04/20/17 1006  metroNIDAZOLE (FLAGYL) tablet 1,000 mg  Status:  Discontinued      1,000 mg Oral 3 times per day 04/20/17 1006 04/20/17 1015   04/20/17 1006  cefoTEtan in Dextrose 5% (CEFOTAN) IVPB 2 g     2 g Intravenous On call to O.R. 04/20/17 1006 04/20/17 1202      Alphonsa Overall, MD, FACS Pager: [facility] Office: [phone] 04/23/2017

## 2017-04-23 NOTE — Discharge Summary (Signed)
Physician Discharge Summary  Patient ID:  [name] [name]  MRN: [id number]  DOB/AGE: 06/03/1965 52 y.o.  Admit date: 04/20/2017 Discharge date: 04/23/2017  Discharge Diagnoses:  1.  Cecal polyp   Active Problems:   Colon polyp  Operation: Procedure(s): LAPAROSCOPIC assisted ILEOCECTOMY  on 04/20/2017 - [facility]. [facility]  Discharged Condition: good  Hospital Course: [name] [name] is an 52 y.o. male whose primary care physician is Eulas Post, MD and who was admitted 04/20/2017 with a chief complaint of Cecal polyp.  He was brought to the operating room on 04/20/2017 and underwent LAPAROSCOPIC assisted ILEOCECTOMY.   He is now 3 days out from surgery.  He had a rough first day, but is doing better now.  He has had a BM.  And is tolerating a diet.  The discharge instructions were reviewed with the patient.  Consults: None  Significant Diagnostic Studies: Results for orders placed or performed during the hospital encounter of 04/20/17  APTT  Result Value Ref Range   aPTT 26 24 - 36 seconds  CBC WITH DIFFERENTIAL  Result Value Ref Range   WBC 8.8 4.0 - 10.5 K/uL   RBC 5.25 4.22 - 5.81 MIL/uL   Hemoglobin 17.3 (H) 13.0 - 17.0 g/dL   HCT 49.3 39.0 - 52.0 %   MCV 93.9 78.0 - 100.0 fL   MCH 33.0 26.0 - 34.0 pg   MCHC 35.1 30.0 - 36.0 g/dL   RDW 12.8 11.5 - 15.5 %   Platelets 268 150 - 400 K/uL   Neutrophils Relative % 64 %   Neutro Abs 5.7 1.7 - 7.7 K/uL   Lymphocytes Relative 20 %   Lymphs Abs 1.7 0.7 - 4.0 K/uL   Monocytes Relative 13 %   Monocytes Absolute 1.1 (H) 0.1 - 1.0 K/uL   Eosinophils Relative 2 %   Eosinophils Absolute 0.2 0.0 - 0.7 K/uL   Basophils Relative 1 %   Basophils Absolute 0.1 0.0 - 0.1 K/uL  Protime-INR  Result Value Ref Range   Prothrombin Time 12.7 11.4 - 15.2 seconds   INR 2.02   Basic metabolic panel  Result Value Ref Range   Sodium 137 135 - 145 mmol/L   Potassium 4.1 3.5 - 5.1 mmol/L   Chloride 105 101 - 111 mmol/L   CO2 21 (L) 22 - 32 mmol/L    Glucose, Bld 115 (H) 65 - 99 mg/dL   BUN 14 6 - 20 mg/dL   Creatinine, Ser 0.76 0.61 - 1.24 mg/dL   Calcium 8.8 (L) 8.9 - 10.3 mg/dL   GFR calc non Af Amer >60 >60 mL/min   GFR calc Af Amer >60 >60 mL/min   Anion gap 11 5 - 15  CBC  Result Value Ref Range   WBC 13.1 (H) 4.0 - 10.5 K/uL   RBC 4.47 4.22 - 5.81 MIL/uL   Hemoglobin 14.3 13.0 - 17.0 g/dL   HCT 42.8 39.0 - 52.0 %   MCV 95.7 78.0 - 100.0 fL   MCH 32.0 26.0 - 34.0 pg   MCHC 33.4 30.0 - 36.0 g/dL   RDW 13.0 11.5 - 15.5 %   Platelets 219 150 - 400 K/uL  Magnesium  Result Value Ref Range   Magnesium 1.6 (L) 1.7 - 2.4 mg/dL  Phosphorus  Result Value Ref Range   Phosphorus 3.7 2.5 - 4.6 mg/dL  CBC  Result Value Ref Range   WBC 11.8 (H) 4.0 - 10.5 K/uL   RBC 4.54 4.22 -  5.81 MIL/uL   Hemoglobin 14.8 13.0 - 17.0 g/dL   HCT 43.5 39.0 - 52.0 %   MCV 95.8 78.0 - 100.0 fL   MCH 32.6 26.0 - 34.0 pg   MCHC 34.0 30.0 - 36.0 g/dL   RDW 13.1 11.5 - 15.5 %   Platelets 212 150 - 400 K/uL  Basic metabolic panel  Result Value Ref Range   Sodium 138 135 - 145 mmol/L   Potassium 4.3 3.5 - 5.1 mmol/L   Chloride 106 101 - 111 mmol/L   CO2 23 22 - 32 mmol/L   Glucose, Bld 105 (H) 65 - 99 mg/dL   BUN 10 6 - 20 mg/dL   Creatinine, Ser 0.79 0.61 - 1.24 mg/dL   Calcium 8.5 (L) 8.9 - 10.3 mg/dL   GFR calc non Af Amer >60 >60 mL/min   GFR calc Af Amer >60 >60 mL/min   Anion gap 9 5 - 15  Magnesium  Result Value Ref Range   Magnesium 2.0 1.7 - 2.4 mg/dL  Phosphorus  Result Value Ref Range   Phosphorus 2.6 2.5 - 4.6 mg/dL  CBC  Result Value Ref Range   WBC 12.6 (H) 4.0 - 10.5 K/uL   RBC 4.35 4.22 - 5.81 MIL/uL   Hemoglobin 14.1 13.0 - 17.0 g/dL   HCT 41.5 39.0 - 52.0 %   MCV 95.4 78.0 - 100.0 fL   MCH 32.4 26.0 - 34.0 pg   MCHC 34.0 30.0 - 36.0 g/dL   RDW 12.8 11.5 - 15.5 %   Platelets 212 150 - 400 K/uL  Basic metabolic panel  Result Value Ref Range   Sodium 139 135 - 145 mmol/L   Potassium 4.2 3.5 - 5.1 mmol/L    Chloride 106 101 - 111 mmol/L   CO2 24 22 - 32 mmol/L   Glucose, Bld 105 (H) 65 - 99 mg/dL   BUN 13 6 - 20 mg/dL   Creatinine, Ser 0.88 0.61 - 1.24 mg/dL   Calcium 8.7 (L) 8.9 - 10.3 mg/dL   GFR calc non Af Amer >60 >60 mL/min   GFR calc Af Amer >60 >60 mL/min   Anion gap 9 5 - 15  Magnesium  Result Value Ref Range   Magnesium 1.9 1.7 - 2.4 mg/dL  Phosphorus  Result Value Ref Range   Phosphorus 3.1 2.5 - 4.6 mg/dL    No results found.  Discharge Exam:  Vitals:   04/23/17 0433 04/23/17 0843  BP: (!) 137/93 (!) 145/86  Pulse: 78 81  Resp: 18   Temp: 98.2 F (36.8 C)   SpO2: 97%     General: WN WM who is alert and generally healthy appearing.  Lungs: Clear to auscultation and symmetric breath sounds. Heart:  RRR. No murmur or rub. Abdomen: Soft.  Normal bowel sounds.  Incisions look good.  Discharge Medications:   Allergies as of 04/23/2017   No Known Allergies     Medication List    TAKE these medications   albuterol 108 (90 Base) MCG/ACT inhaler Commonly known as:  PROVENTIL HFA INHALE 2 PUFFS INTO THE LUNGS EVERY 6 HOURS AS NEEDED What changed:    how much to take  how to take this  when to take this  reasons to take this  additional instructions   AXIRON 30 MG/ACT Soln Generic drug:  Testosterone Place 1 application onto the skin daily.   beclomethasone 80 MCG/ACT inhaler Commonly known as:  QVAR REDIHALER Inhale 2 puffs into the  lungs 2 (two) times daily.   carvedilol 6.25 MG tablet Commonly known as:  COREG TAKE 2 TABLETS BY MOUTH EVERY MORNING AND 1 TABLET EVERY EVENING   HYDROcodone-acetaminophen 5-325 MG tablet Commonly known as:  NORCO/VICODIN Take 1 tablet by mouth every 6 (six) hours as needed for moderate pain.   hyoscyamine 0.125 MG SL tablet Commonly known as:  LEVSIN SL Take 1 tablet (0.125 mg total) by mouth every 4 (four) hours as needed. What changed:  reasons to take this   ibuprofen 200 MG tablet Commonly known as:   ADVIL,MOTRIN Take 400-600 mg by mouth daily as needed for headache or moderate pain.   omeprazole 40 MG capsule Commonly known as:  PRILOSEC TAKE 1 CAPSULE(40 MG) BY MOUTH DAILY   oxymetazoline 0.05 % nasal spray Commonly known as:  AFRIN Place 1 spray into both nostrils daily as needed for congestion.   traMADol 50 MG tablet Commonly known as:  ULTRAM Take 1 tablet (50 mg total) by mouth every 6 (six) hours as needed for up to 7 days (pain not controlled with ibuprofen/tylenol).   VIAGRA 100 MG tablet Generic drug:  sildenafil Take 100 mg by mouth as needed for erectile dysfunction.       Disposition: Discharge disposition: 01-Home or Self Care       Discharge Instructions    Diet - low sodium heart healthy   Complete by:  As directed    Increase activity slowly   Complete by:  As directed       Signed: Alphonsa Overall, M.D., [facility] Office:  [phone]  04/23/2017, 9:51 AM

## 2017-06-16 ENCOUNTER — Other Ambulatory Visit: Payer: Self-pay | Admitting: Family Medicine

## 2017-06-27 ENCOUNTER — Ambulatory Visit: Payer: BLUE CROSS/BLUE SHIELD | Admitting: Family Medicine

## 2017-06-27 ENCOUNTER — Encounter: Payer: Self-pay | Admitting: Family Medicine

## 2017-06-27 VITALS — BP 130/90 | HR 76 | Temp 98.3°F | Wt 201.2 lb

## 2017-06-27 DIAGNOSIS — G5712 Meralgia paresthetica, left lower limb: Secondary | ICD-10-CM | POA: Diagnosis not present

## 2017-06-27 NOTE — Progress Notes (Signed)
  Subjective:     Patient ID: [name] [name], male   DOB: [birth year], 52 y.o.   MRN: [id number]  HPI patient seen with onset around February of some left anterior and lateral thigh numbness and burning sensation. Started off with some numbness and subsequently developed some burning sensation. No weakness. No leg symptoms. No urine or stool incontinence. No back pain.  No history of similar problem. No right-sided symptoms. No upper extremity symptoms. Occasionally has a sensation of a "shock" left upper anterior lateral thigh.  No associated skin rashes  Past Medical History:  Diagnosis Date  . Asthma    wheezing and cough  . Daily headache   . Diverticula of colon   . Dysrhythmia   . Enlarged prostate with lower urinary tract symptoms (LUTS)   . GERD 12/23/2009  . H/O wheezing    uses inhaler  . History of rectal bleeding   . Hypertension   . IBS (irritable bowel syndrome)   . Internal hemorrhoids   . Nonsustained ventricular tachycardia ([place]) 06/2013  . Pneumonia 10/2012  . PVC (premature ventricular contraction)   . Serrated adenoma of colon   . Shortness of breath    "at rest" (11/22/2012)   Past Surgical History:  Procedure Laterality Date  . COLONOSCOPY W/ BIOPSIES AND POLYPECTOMY  02/21/2017  . DIAGNOSTIC LAPAROSCOPY     versus open ileocectomy Dr. Vashti Hey 04-20-17  . LAPAROSCOPIC RIGHT HEMI COLECTOMY Right 04/20/2017   Procedure: LAPAROSCOPIC VS OPEN ILEOCECTOMY ERAS PATHWAY;  Surgeon: Ileana Roup, MD;  Location: [facility];  Service: General;  Laterality: Right;  [place] VASECTOMY  1990's    reports that he has been smoking cigarettes.  He has a 23.00 pack-year smoking history. He has never used smokeless tobacco. He reports that he drinks about 16.8 oz of alcohol per week. He reports that he does not use drugs. family history includes Colon polyps in his father; Diabetes in his maternal grandmother; Prostate cancer in his paternal grandfather; Stomach cancer in his  paternal uncle; Stroke in his mother and paternal grandfather. No Known Allergies  Review of Systems  Gastrointestinal: Negative for abdominal pain.  Musculoskeletal: Negative for back pain.  Skin: Negative for rash.  Neurological: Positive for numbness. Negative for weakness.  Hematological: Negative for adenopathy.       Objective:   Physical Exam  Constitutional: He appears well-developed and well-nourished.  Cardiovascular: Normal rate and regular rhythm.  Musculoskeletal: He exhibits no edema.  Straight leg raise are negative bilaterally. No edema.  Neurological:  He has full strength lower extremities with plantar flexion, dorsiflexion, knee extension, and hip flexion bilaterally. He has impairment with sensory function with monofilament testing left anterior and lateral thigh in distribution consistent with lateral femoral cutaneous nerve.  Deep tendon reflexes are 2+ knee and ankle bilaterally. 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 He does not have any red flag symptoms such as weakness or leg involvement    Plan:     -We explained this is usually self-limited. -Follow-up immediately for any weakness or lower extremity symptoms involving leg -Touch base in 2-3 months of not resolving -We discussed potential medication options for treating his burning dysesthesia such as gabapentin but this point he does not feel severity warrants  Eulas Post MD [facility]

## 2017-06-27 NOTE — Patient Instructions (Signed)
Let me know if not better in a couple of months  Suspect Meralgia Paresthetica (lateral femoral cutaneous nerve entrapment)  Follow up for any atypical features such as weakness or lower leg symptoms.

## 2017-08-23 DIAGNOSIS — Z125 Encounter for screening for malignant neoplasm of prostate: Secondary | ICD-10-CM | POA: Diagnosis not present

## 2017-08-23 DIAGNOSIS — E291 Testicular hypofunction: Secondary | ICD-10-CM | POA: Diagnosis not present

## 2017-08-31 DIAGNOSIS — E291 Testicular hypofunction: Secondary | ICD-10-CM | POA: Diagnosis not present

## 2017-08-31 DIAGNOSIS — N5201 Erectile dysfunction due to arterial insufficiency: Secondary | ICD-10-CM | POA: Diagnosis not present

## 2017-09-03 ENCOUNTER — Other Ambulatory Visit: Payer: Self-pay | Admitting: Family Medicine

## 2017-10-14 ENCOUNTER — Other Ambulatory Visit: Payer: Self-pay | Admitting: Family Medicine

## 2017-12-06 ENCOUNTER — Other Ambulatory Visit: Payer: Self-pay

## 2017-12-06 MED ORDER — CARVEDILOL 6.25 MG PO TABS: ORAL_TABLET | ORAL | 5 refills | Status: DC

## 2018-02-20 DIAGNOSIS — Z125 Encounter for screening for malignant neoplasm of prostate: Secondary | ICD-10-CM | POA: Diagnosis not present

## 2018-02-20 DIAGNOSIS — E291 Testicular hypofunction: Secondary | ICD-10-CM | POA: Diagnosis not present

## 2018-02-27 ENCOUNTER — Other Ambulatory Visit: Payer: Self-pay | Admitting: Family Medicine

## 2018-03-01 DIAGNOSIS — E291 Testicular hypofunction: Secondary | ICD-10-CM | POA: Diagnosis not present

## 2018-03-01 DIAGNOSIS — N5201 Erectile dysfunction due to arterial insufficiency: Secondary | ICD-10-CM | POA: Diagnosis not present

## 2018-03-31 ENCOUNTER — Other Ambulatory Visit: Payer: Self-pay | Admitting: Family Medicine

## 2018-05-29 ENCOUNTER — Other Ambulatory Visit: Payer: Self-pay | Admitting: Family Medicine

## 2018-05-30 ENCOUNTER — Ambulatory Visit (INDEPENDENT_AMBULATORY_CARE_PROVIDER_SITE_OTHER): Payer: BLUE CROSS/BLUE SHIELD | Admitting: Family Medicine

## 2018-05-30 ENCOUNTER — Other Ambulatory Visit: Payer: Self-pay

## 2018-05-30 DIAGNOSIS — K219 Gastro-esophageal reflux disease without esophagitis: Secondary | ICD-10-CM | POA: Diagnosis not present

## 2018-05-30 DIAGNOSIS — I471 Supraventricular tachycardia: Secondary | ICD-10-CM | POA: Diagnosis not present

## 2018-05-30 DIAGNOSIS — Z87898 Personal history of other specified conditions: Secondary | ICD-10-CM | POA: Diagnosis not present

## 2018-05-30 MED ORDER — OMEPRAZOLE 40 MG PO CPDR: DELAYED_RELEASE_CAPSULE | ORAL | 3 refills | Status: DC

## 2018-05-30 MED ORDER — BECLOMETHASONE DIPROP HFA 80 MCG/ACT IN AERB: 2.0000 | INHALATION_SPRAY | Freq: Two times a day (BID) | RESPIRATORY_TRACT | 11 refills | Status: DC

## 2018-05-30 MED ORDER — CARVEDILOL 6.25 MG PO TABS: 6.2500 mg | ORAL_TABLET | Freq: Two times a day (BID) | ORAL | 3 refills | Status: DC

## 2018-05-30 NOTE — Progress Notes (Signed)
Patient ID: [name] [name], male   DOB: 09/15/1965, 53 y.o.   MRN: [id number]  This visit type was conducted due to national recommendations for restrictions regarding the COVID-19 pandemic in an effort to limit this patient's exposure and mitigate transmission in our community.   Virtual Visit via Video Note  I connected with [name] [name] on 05/30/18 at 10:15 AM EDT by a video enabled telemedicine application and verified that I am speaking with the correct person using two identifiers.  Location patient: home Location provider:work or home office Persons participating in the virtual visit: patient, provider  I discussed the limitations of evaluation and management by telemedicine and the availability of in person appointments. The patient expressed understanding and agreed to proceed.   HPI: Needs refills for several medications.  He has history of GERD and takes omeprazole 40 mg daily.  GERD symptoms are well controlled.  No recent dysphagia.  Appetite and weight stable.  He does have history of frequent coughing and wheezing.  He states this tends to be worse at night and early morning.  He is not aware of any active reflux symptoms.  Takes Qvar though inconsistently.  They do have 3 dogs at home which may be exacerbating.  They have had them for quite some time.  He has history of supraventricular tachycardia.  He takes carvedilol.  He is on dosage of 6.25 mg but frequently skips doses.  He has not had any recent breakthrough symptoms.  No recent chest pains.  History of cecal polyp of the colon.  He had surgery last year for that.  He will need repeat colonoscopy by 2022  He has history of low testosterone and is on replacement per urology   ROS: See pertinent positives and negatives per HPI.  Past Medical History:  Diagnosis Date  . Asthma    wheezing and cough  . Daily headache   . Diverticula of colon   . Dysrhythmia   . Enlarged prostate with lower urinary tract symptoms  (LUTS)   . GERD 12/23/2009  . H/O wheezing    uses inhaler  . History of rectal bleeding   . Hypertension   . IBS (irritable bowel syndrome)   . Internal hemorrhoids   . Nonsustained ventricular tachycardia ([place]) 06/2013  . Pneumonia 10/2012  . PVC (premature ventricular contraction)   . Serrated adenoma of colon   . Shortness of breath    "at rest" (11/22/2012)    Past Surgical History:  Procedure Laterality Date  . COLONOSCOPY W/ BIOPSIES AND POLYPECTOMY  02/21/2017  . DIAGNOSTIC LAPAROSCOPY     versus open ileocectomy Dr. Vashti Hey 04-20-17  . LAPAROSCOPIC RIGHT HEMI COLECTOMY Right 04/20/2017   Procedure: LAPAROSCOPIC VS OPEN ILEOCECTOMY ERAS PATHWAY;  Surgeon: Ileana Roup, MD;  Location: [facility];  Service: General;  Laterality: Right;  Marland Kitchen VASECTOMY  1990's    Family History  Problem Relation Age of Onset  . Colon polyps Father   . Stroke Mother   . Prostate cancer Paternal Grandfather   . Stroke Paternal Grandfather   . Stomach cancer Paternal Uncle   . Diabetes Maternal Grandmother   . Colon cancer Neg Hx   . Esophageal cancer Neg Hx   . Rectal cancer Neg Hx   . Pancreatic cancer Neg Hx     SOCIAL HX: History of smoking   Current Outpatient Medications:  .  albuterol (PROVENTIL HFA) 108 (90 Base) MCG/ACT inhaler, INHALE 2 PUFFS INTO THE LUNGS EVERY 6 HOURS  AS NEEDED (Patient taking differently: Inhale 2 puffs into the lungs every 6 (six) hours as needed for shortness of breath. ), Disp: 6.7 g, Rfl: 5 .  beclomethasone (QVAR REDIHALER) 80 MCG/ACT inhaler, Inhale 2 puffs into the lungs 2 (two) times daily., Disp: 10.6 g, Rfl: 11 .  carvedilol (COREG) 6.25 MG tablet, Take 1 tablet (6.25 mg total) by mouth 2 (two) times daily with a meal., Disp: 180 tablet, Rfl: 3 .  hyoscyamine (LEVSIN SL) 0.125 MG SL tablet, Take 1 tablet (0.125 mg total) by mouth every 4 (four) hours as needed. (Patient taking differently: Take 0.125 mg by mouth every 4 (four) hours as needed  (ibs symptoms). ), Disp: 30 tablet, Rfl: 1 .  ibuprofen (ADVIL,MOTRIN) 200 MG tablet, Take 400-600 mg by mouth daily as needed for headache or moderate pain., Disp: , Rfl:  .  omeprazole (PRILOSEC) 40 MG capsule, TAKE 1 CAPSULE BY MOUTH EVERY DAY, Disp: 90 capsule, Rfl: 3 .  oxymetazoline (AFRIN) 0.05 % nasal spray, Place 1 spray into both nostrils daily as needed for congestion., Disp: , Rfl:  .  Testosterone (AXIRON) 30 MG/ACT SOLN, Place 1 application onto the skin daily., Disp: , Rfl:  .  VIAGRA 100 MG tablet, Take 100 mg by mouth as needed for erectile dysfunction. , Disp: , Rfl:   EXAM:  VITALS per patient if applicable:  GENERAL: alert, oriented, appears well and in no acute distress  HEENT: atraumatic, conjunttiva clear, no obvious abnormalities on inspection of external nose and ears  NECK: normal movements of the head and neck  LUNGS: on inspection no signs of respiratory distress, breathing rate appears normal, no obvious gross SOB, gasping or wheezing  CV: no obvious cyanosis  MS: moves all visible extremities without noticeable abnormality  PSYCH/NEURO: pleasant and cooperative, no obvious depression or anxiety, speech and thought processing grossly intact  ASSESSMENT AND PLAN:  Discussed the following assessment and plan:  #1 history of nonsustained supraventricular tachycardia currently symptomatically stable -Increase compliance with carvedilol -Refill carvedilol for 1 year  #2 history of GERD controlled with omeprazole -Refill omeprazole for 1 year  #3 history of frequent wheezing.  Suspect moderate persistent asthma -Increase compliance with Qvar.  This was refilled for 1 year -Provider to rinse mouth thoroughly after each use of Qvar -Be in touch in 3 to 4 weeks if not seeing better control with increased compliance with Qvar     I discussed the assessment and treatment plan with the patient. The patient was provided an opportunity to ask questions and  all were answered. The patient agreed with the plan and demonstrated an understanding of the instructions.   The patient was advised to call back or seek an in-person evaluation if the symptoms worsen or if the condition fails to improve as anticipated.     Carolann Littler, MD

## 2018-05-30 NOTE — Telephone Encounter (Signed)
Patient has an appointment today

## 2018-08-31 DIAGNOSIS — Z125 Encounter for screening for malignant neoplasm of prostate: Secondary | ICD-10-CM | POA: Diagnosis not present

## 2018-08-31 DIAGNOSIS — E291 Testicular hypofunction: Secondary | ICD-10-CM | POA: Diagnosis not present

## 2018-09-07 DIAGNOSIS — E291 Testicular hypofunction: Secondary | ICD-10-CM | POA: Diagnosis not present

## 2018-09-07 DIAGNOSIS — N5201 Erectile dysfunction due to arterial insufficiency: Secondary | ICD-10-CM | POA: Diagnosis not present

## 2018-12-27 DIAGNOSIS — Z20828 Contact with and (suspected) exposure to other viral communicable diseases: Secondary | ICD-10-CM | POA: Diagnosis not present

## 2019-03-03 DIAGNOSIS — Z20828 Contact with and (suspected) exposure to other viral communicable diseases: Secondary | ICD-10-CM | POA: Diagnosis not present

## 2019-03-27 ENCOUNTER — Telehealth: Payer: Self-pay | Admitting: Family Medicine

## 2019-03-27 ENCOUNTER — Encounter: Payer: Self-pay | Admitting: *Deleted

## 2019-03-27 NOTE — Telephone Encounter (Signed)
Medication Refill: albuterol  Pharmacy: [facility] [address] FAX: [phone]

## 2019-03-27 NOTE — Telephone Encounter (Signed)
Patient informed that he need to schedule a visit for medication refills.

## 2019-03-28 ENCOUNTER — Ambulatory Visit (INDEPENDENT_AMBULATORY_CARE_PROVIDER_SITE_OTHER): Payer: BC Managed Care – PPO | Admitting: Family Medicine

## 2019-03-28 ENCOUNTER — Encounter: Payer: Self-pay | Admitting: Family Medicine

## 2019-03-28 ENCOUNTER — Other Ambulatory Visit: Payer: Self-pay

## 2019-03-28 DIAGNOSIS — J4540 Moderate persistent asthma, uncomplicated: Secondary | ICD-10-CM | POA: Insufficient documentation

## 2019-03-28 MED ORDER — ALBUTEROL SULFATE HFA 108 (90 BASE) MCG/ACT IN AERS: 2.0000 | INHALATION_SPRAY | RESPIRATORY_TRACT | 2 refills | Status: AC | PRN

## 2019-03-28 NOTE — Patient Instructions (Signed)
http://www.aaaai.org/conditions-and-treatments/asthma">  Asthma, Adult  Asthma is a long-term (chronic) condition that causes recurrent episodes in which the airways become tight and narrow. The airways are the passages that lead from the nose and mouth down into the lungs. Asthma episodes, also called asthma attacks, can cause coughing, wheezing, shortness of breath, and chest pain. The airways can also fill with mucus. During an attack, it can be difficult to breathe. Asthma attacks can range from minor to life threatening. Asthma cannot be cured, but medicines and lifestyle changes can help control it and treat acute attacks. What are the causes? This condition is believed to be caused by inherited (genetic) and environmental factors, but its exact cause is not known. There are many things that can bring on an asthma attack or make asthma symptoms worse (triggers). Asthma triggers are different for each person. Common triggers include:  Mold.  Dust.  Cigarette smoke.  Cockroaches.  Things that can cause allergy symptoms (allergens), such as animal dander or pollen from trees or grass.  Air pollutants such as household cleaners, wood smoke, smog, or chemical odors.  Cold air, weather changes, and winds (which increase molds and pollen in the air).  Strong emotional expressions such as crying or laughing hard.  Stress.  Certain medicines (such as aspirin) or types of medicines (such as beta-blockers).  Sulfites in foods and drinks. Foods and drinks that may contain sulfites include dried fruit, potato chips, and sparkling grape juice.  Infections or inflammatory conditions such as the flu, a cold, or inflammation of the nasal membranes (rhinitis).  Gastroesophageal reflux disease (GERD).  Exercise or strenuous activity. What are the signs or symptoms? Symptoms of this condition may occur right after asthma is triggered or many hours later. Symptoms include:  Wheezing. This can  sound like whistling when you breathe.  Excessive nighttime or early morning coughing.  Frequent or severe coughing with a common cold.  Chest tightness.  Shortness of breath.  Tiredness (fatigue) with minimal activity. How is this diagnosed? This condition is diagnosed based on:  Your medical history.  A physical exam.  Tests, which may include: ? Lung function studies and pulmonary studies (spirometry). These tests can evaluate the flow of air in your lungs. ? Allergy tests. ? Imaging tests, such as X-rays. How is this treated? There is no cure for this condition, but treatment can help control your symptoms. Treatment for asthma usually involves:  Identifying and avoiding your asthma triggers.  Using medicines to control your symptoms. Generally, two types of medicines are used to treat asthma: ? Controller medicines. These help prevent asthma symptoms from occurring. They are usually taken every day. ? Fast-acting reliever or rescue medicines. These quickly relieve asthma symptoms by widening the narrow and tight airways. They are used as needed and provide short-term relief.  Using supplemental oxygen. This may be needed during a severe episode.  Using other medicines, such as: ? Allergy medicines, such as antihistamines, if your asthma attacks are triggered by allergens. ? Immune medicines (immunomodulators). These are medicines that help control the immune system.  Creating an asthma action plan. An asthma action plan is a written plan for managing and treating your asthma attacks. This plan includes: ? A list of your asthma triggers and how to avoid them. ? Information about when medicines should be taken and when their dosage should be changed. ? Instructions about using a device called a peak flow meter. A peak flow meter measures how well the lungs are working   and the severity of your asthma. It helps you monitor your condition. Follow these instructions at  home: Controlling your home environment Control your home environment in the following ways to help avoid triggers and prevent asthma attacks:  Change your heating and air conditioning filter regularly.  Limit your use of fireplaces and wood stoves.  Get rid of pests (such as roaches and mice) and their droppings.  Throw away plants if you see mold on them.  Clean floors and dust surfaces regularly. Use unscented cleaning products.  Try to have someone else vacuum for you regularly. Stay out of rooms while they are being vacuumed and for a short while afterward. If you vacuum, use a dust mask from a hardware store, a double-layered or microfilter vacuum cleaner bag, or a vacuum cleaner with a HEPA filter.  Replace carpet with wood, tile, or vinyl flooring. Carpet can trap dander and dust.  Use allergy-proof pillows, mattress covers, and box spring covers.  Keep your bedroom a trigger-free room.  Avoid pets and keep windows closed when allergens are in the air.  Wash beddings every week in hot water and dry them in a dryer.  Use blankets that are made of polyester or cotton.  Clean bathrooms and kitchens with bleach. If possible, have someone repaint the walls in these rooms with mold-resistant paint. Stay out of the rooms that are being cleaned and painted.  Wash your hands often with soap and water. If soap and water are not available, use hand sanitizer.  Do not allow anyone to smoke in your home. General instructions  Take over-the-counter and prescription medicines only as told by your health care provider. ? Speak with your health care provider if you have questions about how or when to take the medicines. ? Make note if you are requiring more frequent dosages.  Do not use any products that contain nicotine or tobacco, such as cigarettes and e-cigarettes. If you need help quitting, ask your health care provider. Also, avoid being exposed to secondhand smoke.  Use a peak  flow meter as told by your health care provider. Record and keep track of the readings.  Understand and use the asthma action plan to help minimize, or stop an asthma attack, without needing to seek medical care.  Make sure you stay up to date on your yearly vaccinations as told by your health care provider. This may include vaccines for the flu and pneumonia.  Avoid outdoor activities when allergen counts are high and when air quality is low.  Wear a ski mask that covers your nose and mouth during outdoor winter activities. Exercise indoors on cold days if you can.  Warm up before exercising, and take time for a cool-down period after exercise.  Keep all follow-up visits as told by your health care provider. This is important. Where to find more information  For information about asthma, turn to the Centers for Disease Control and Prevention at www.cdc.gov/asthma/faqs.htm  For air quality information, turn to AirNow at https://airnow.gov/ Contact a health care provider if:  You have wheezing, shortness of breath, or a cough even while you are taking medicine to prevent attacks.  The mucus you cough up (sputum) is thicker than usual.  Your sputum changes from clear or white to yellow, green, gray, or bloody.  Your medicines are causing side effects, such as a rash, itching, swelling, or trouble breathing.  You need to use a reliever medicine more than 2-3 times a week.  Your peak   flow reading is still at 50-79% of your personal best after following your action plan for 1 hour.  You have a fever. Get help right away if:  You are getting worse and do not respond to treatment during an asthma attack.  You are short of breath when at rest or when doing very little physical activity.  You have difficulty eating, drinking, or talking.  You have chest pain or tightness.  You develop a fast heartbeat or palpitations.  You have a bluish color to your lips or fingernails.  You  are light-headed or dizzy, or you faint.  Your peak flow reading is less than 50% of your personal best.  You feel too tired to breathe normally. Summary  Asthma is a long-term (chronic) condition that causes recurrent episodes in which the airways become tight and narrow. These episodes can cause coughing, wheezing, shortness of breath, and chest pain.  Asthma cannot be cured, but medicines and lifestyle changes can help control it and treat acute attacks.  Make sure you understand how to avoid triggers and how and when to use your medicines.  Asthma attacks can range from minor to life threatening. Get help right away if you have an asthma attack and do not respond to treatment with your usual rescue medicines. This information is not intended to replace advice given to you by your health care provider. Make sure you discuss any questions you have with your health care provider. Document Revised: 03/16/2018 Document Reviewed: 02/16/2016 Elsevier Patient Education  2020 Elsevier Inc.  

## 2019-03-28 NOTE — Progress Notes (Signed)
Subjective:     Patient ID: [name] [name], male   DOB: 10-06-1965, 54 y.o.   MRN: [id number]  HPI [name] [name] is here for follow-up regarding persistent asthma.  He still smokes about a pack to a pack and half cigarettes per day.  He is on Qvar and takes 80 mcg 2 puffs twice daily.  Usually requires Proventil inhaler about 2 times per month.  Tends to wheeze more at night.  They do have 3 dogs that are indoor dogs.  Denies any significant dyspnea at baseline.  No recent increased cough or hemoptysis.  Approximately 30-pack-year history of smoking.  Past Medical History:  Diagnosis Date  . Asthma    wheezing and cough  . Daily headache   . Diverticula of colon   . Dysrhythmia   . Enlarged prostate with lower urinary tract symptoms (LUTS)   . GERD 12/23/2009  . H/O wheezing    uses inhaler  . History of rectal bleeding   . Hypertension   . IBS (irritable bowel syndrome)   . Internal hemorrhoids   . Nonsustained ventricular tachycardia ([place]) 06/2013  . Pneumonia 10/2012  . PVC (premature ventricular contraction)   . Serrated adenoma of colon   . Shortness of breath    "at rest" (11/22/2012)   Past Surgical History:  Procedure Laterality Date  . COLONOSCOPY W/ BIOPSIES AND POLYPECTOMY  02/21/2017  . DIAGNOSTIC LAPAROSCOPY     versus open ileocectomy Dr. Vashti Hey 04-20-17  . LAPAROSCOPIC RIGHT HEMI COLECTOMY Right 04/20/2017   Procedure: LAPAROSCOPIC VS OPEN ILEOCECTOMY [facility] PATHWAY;  Surgeon: Ileana Roup, MD;  Location: [facility];  Service: General;  Laterality: Right;  [place] VASECTOMY  1990's    reports that he has been smoking cigarettes. He has a 23.00 pack-year smoking history. He has never used smokeless tobacco. He reports current alcohol use of about 28.0 standard drinks of alcohol per week. He reports that he does not use drugs. family history includes Colon polyps in his father; Diabetes in his maternal grandmother; Prostate cancer in his paternal grandfather; Stomach cancer in  his paternal uncle; Stroke in his mother and paternal grandfather. No Known Allergies  Wt Readings from Last 3 Encounters:  03/28/19 210 lb 7 oz (95.5 kg)  06/27/17 201 lb 3.2 oz (91.3 kg)  04/23/17 216 lb 14.9 oz (98.4 kg)     Review of Systems  Constitutional: Negative for appetite change, chills, fever and unexpected weight change.  Respiratory: Negative for cough and shortness of breath.   Cardiovascular: Negative for chest pain and leg swelling.       Objective:   Physical Exam Vitals reviewed.  Constitutional:      Appearance: Normal appearance.  Cardiovascular:     Rate and Rhythm: Normal rate and regular rhythm.  Pulmonary:     Effort: Pulmonary effort is normal.     Breath sounds: Normal breath sounds. No wheezing or rales.  Neurological:     Mental Status: He is alert.        Assessment:     Moderate persistent asthma.  Controlled fairly well with Qvar.  Infrequent use of albuterol    Plan:     -Encouraged to quit smoking -Refill Proventil for as needed use of rescue inhaler -Continue twice daily use of Qvar-reminded to rinse mouth after use to prevent thrush -We discussed low-dose CT lung cancer screening program and at this point he declines.  He does appear to meet criteria  Eulas Post MD [place]  [facility]

## 2019-04-19 ENCOUNTER — Ambulatory Visit: Payer: BC Managed Care – PPO

## 2019-06-19 ENCOUNTER — Telehealth: Payer: Self-pay | Admitting: Family Medicine

## 2019-06-19 MED ORDER — QVAR REDIHALER 80 MCG/ACT IN AERB: 2.0000 | INHALATION_SPRAY | Freq: Two times a day (BID) | RESPIRATORY_TRACT | 11 refills | Status: DC

## 2019-06-19 NOTE — Telephone Encounter (Signed)
Refill sent in

## 2019-06-19 NOTE — Addendum Note (Signed)
Addended by: Modena Morrow R on: 06/19/2019 01:41 PM   Modules accepted: Orders

## 2019-06-19 NOTE — Telephone Encounter (Signed)
Medication Refill:  QVAR Pharmacy: [facility]: [id number]   Pt stated the pharmacy sent over a request a week ago and haven't heard anything back.

## 2019-08-13 ENCOUNTER — Other Ambulatory Visit: Payer: Self-pay | Admitting: Family Medicine

## 2019-08-14 NOTE — Telephone Encounter (Signed)
Please advise on omeprazole we have not filled before

## 2019-08-14 NOTE — Telephone Encounter (Signed)
Refill once 

## 2019-08-25 DIAGNOSIS — J45998 Other asthma: Secondary | ICD-10-CM | POA: Diagnosis not present

## 2019-08-25 DIAGNOSIS — R05 Cough: Secondary | ICD-10-CM | POA: Diagnosis not present

## 2019-08-25 DIAGNOSIS — J40 Bronchitis, not specified as acute or chronic: Secondary | ICD-10-CM | POA: Diagnosis not present

## 2019-08-25 DIAGNOSIS — F17210 Nicotine dependence, cigarettes, uncomplicated: Secondary | ICD-10-CM | POA: Diagnosis not present

## 2019-09-07 DIAGNOSIS — R948 Abnormal results of function studies of other organs and systems: Secondary | ICD-10-CM | POA: Diagnosis not present

## 2019-09-07 DIAGNOSIS — E291 Testicular hypofunction: Secondary | ICD-10-CM | POA: Diagnosis not present

## 2019-09-14 DIAGNOSIS — N5201 Erectile dysfunction due to arterial insufficiency: Secondary | ICD-10-CM | POA: Diagnosis not present

## 2019-09-14 DIAGNOSIS — E291 Testicular hypofunction: Secondary | ICD-10-CM | POA: Diagnosis not present

## 2020-01-11 DIAGNOSIS — Z20822 Contact with and (suspected) exposure to covid-19: Secondary | ICD-10-CM | POA: Diagnosis not present

## 2020-07-03 ENCOUNTER — Other Ambulatory Visit: Payer: Self-pay | Admitting: Family Medicine

## 2020-07-09 ENCOUNTER — Other Ambulatory Visit: Payer: Self-pay

## 2020-07-09 ENCOUNTER — Encounter: Payer: Self-pay | Admitting: Family Medicine

## 2020-07-09 ENCOUNTER — Ambulatory Visit (INDEPENDENT_AMBULATORY_CARE_PROVIDER_SITE_OTHER): Payer: BC Managed Care – PPO | Admitting: Family Medicine

## 2020-07-09 VITALS — BP 160/90 | HR 85 | Temp 98.1°F | Wt 205.8 lb

## 2020-07-09 DIAGNOSIS — R030 Elevated blood-pressure reading, without diagnosis of hypertension: Secondary | ICD-10-CM | POA: Diagnosis not present

## 2020-07-09 DIAGNOSIS — J4540 Moderate persistent asthma, uncomplicated: Secondary | ICD-10-CM | POA: Diagnosis not present

## 2020-07-09 DIAGNOSIS — Z720 Tobacco use: Secondary | ICD-10-CM | POA: Diagnosis not present

## 2020-07-09 DIAGNOSIS — R062 Wheezing: Secondary | ICD-10-CM

## 2020-07-09 DIAGNOSIS — R200 Anesthesia of skin: Secondary | ICD-10-CM

## 2020-07-09 MED ORDER — BUDESONIDE-FORMOTEROL FUMARATE 160-4.5 MCG/ACT IN AERO: 2.0000 | INHALATION_SPRAY | Freq: Two times a day (BID) | RESPIRATORY_TRACT | 5 refills | Status: DC

## 2020-07-09 NOTE — Patient Instructions (Signed)
Get home BP cuff and monitor at least a few times per week and be in touch if consistently > 140/90.    Stop the Qvar and start Symbicort  We will set up pulmonary function tests and neurology consult.

## 2020-07-09 NOTE — Progress Notes (Signed)
Established Patient Office Visit  Subjective:  Patient ID: [name] [name], male    DOB: Sep 04, 1965  Age: 55 y.o. MRN: [id number]  CC:  Chief Complaint  Patient presents with   Medication Refill    App. Required for refills    HPI [name] [name] presents for several things as follows  History of moderate persistent asthma.  Has been on Qvar for some time.  He does have some breakthrough wheezing intermittently.  Unfortunately still smoking about a pack and half cigarettes per day.  Does have some dyspnea with activity.  No recent fevers.  No hx of PFTs.    He has elevated blood pressure today and has had borderline readings that have been elevated in the past.  He has history of nonsustained V. tach and is on carvedilol.  Never treated with any other blood pressure medications.  No recent headaches or dizziness.  He presented couple years ago with some left anterior thigh numbness.  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  He states he had some persistent symptoms.  Question of occasional weakness.  No low back pain.  No urine or stool incontinence.  Past Medical History:  Diagnosis Date   Asthma    wheezing and cough   Daily headache    Diverticula of colon    Dysrhythmia    Enlarged prostate with lower urinary tract symptoms (LUTS)    GERD 12/23/2009   H/O wheezing    uses inhaler   History of rectal bleeding    Hypertension    IBS (irritable bowel syndrome)    Internal hemorrhoids    Nonsustained ventricular tachycardia ([place]) 06/2013   Pneumonia 10/2012   PVC (premature ventricular contraction)    Serrated adenoma of colon    Shortness of breath    "at rest" (11/22/2012)    Past Surgical History:  Procedure Laterality Date   COLONOSCOPY W/ BIOPSIES AND POLYPECTOMY  02/21/2017   DIAGNOSTIC LAPAROSCOPY     versus open ileocectomy Dr. Vashti Hey 04-20-17   LAPAROSCOPIC RIGHT HEMI COLECTOMY Right 04/20/2017   Procedure: LAPAROSCOPIC VS OPEN [place];  Surgeon:  Ileana Roup, MD;  Location: [facility];  Service: General;  Laterality: Right;   VASECTOMY  1990's    Family History  Problem Relation Age of Onset   Colon polyps Father    Stroke Mother    Prostate cancer Paternal Grandfather    Stroke Paternal Grandfather    Stomach cancer Paternal Uncle    Diabetes Maternal Grandmother    Colon cancer Neg Hx    Esophageal cancer Neg Hx    Rectal cancer Neg Hx    Pancreatic cancer Neg Hx     Social History   Socioeconomic History   Marital status: Married    Spouse name: Not on file   Number of children: 3   Years of education: Not on file   Highest education level: Not on file  Occupational History   Occupation: Music therapist    Employer: [employer]   Occupation: OWNER    Employer: [employer]  Tobacco Use   Smoking status: Some Days    Packs/day: 1.00    Years: 23.00    Pack years: 23.00    Types: Cigarettes   Smokeless tobacco: Never  Vaping Use   Vaping Use: Never used  Substance and Sexual Activity   Alcohol use: Yes    Alcohol/week: 28.0 standard drinks    Types: 28 Shots of liquor per week  Comment: 11/21/2012 "couple shots in each drink; 2 mixed drinks/night"   Drug use: No   Sexual activity: Yes  Other Topics Concern   Not on file  Social History Narrative   Patient does regular exercise.         Social Determinants of Health   Financial Resource Strain: Not on file  Food Insecurity: Not on file  Transportation Needs: Not on file  Physical Activity: Not on file  Stress: Not on file  Social Connections: Not on file  Intimate Partner Violence: Not on file    Outpatient Medications Prior to Visit  Medication Sig Dispense Refill   albuterol (PROVENTIL HFA) 108 (90 Base) MCG/ACT inhaler Inhale 2 puffs into the lungs every 4 (four) hours as needed for shortness of breath. 18 g 2   carvedilol (COREG) 6.25 MG tablet TAKE 1 TABLET(6.25 MG) BY MOUTH TWICE DAILY WITH A MEAL 180  tablet 3   ibuprofen (ADVIL,MOTRIN) 200 MG tablet Take 400-600 mg by mouth daily as needed for headache or moderate pain.     omeprazole (PRILOSEC) 40 MG capsule TAKE 1 CAPSULE BY MOUTH EVERY DAY 90 capsule 0   oxymetazoline (AFRIN) 0.05 % nasal spray Place 1 spray into both nostrils daily as needed for congestion.     Testosterone (AXIRON) 30 MG/ACT SOLN Place 1 application onto the skin daily.     VIAGRA 100 MG tablet Take 100 mg by mouth as needed for erectile dysfunction.      beclomethasone (QVAR REDIHALER) 80 MCG/ACT inhaler Inhale 2 puffs into the lungs 2 (two) times daily. 10.6 g 11   No facility-administered medications prior to visit.    No Known Allergies  ROS Review of Systems  Constitutional:  Negative for chills and fever.  Respiratory:  Positive for wheezing. Negative for cough.   Cardiovascular:  Negative for chest pain.  Musculoskeletal:  Positive for back pain.  Neurological:  Positive for numbness.     Objective:    Physical Exam Vitals reviewed.  Constitutional:      Appearance: Normal appearance.  Cardiovascular:     Rate and Rhythm: Normal rate and regular rhythm.  Pulmonary:     Effort: Pulmonary effort is normal.     Breath sounds: Normal breath sounds.  Musculoskeletal:     Right lower leg: No edema.     Left lower leg: No edema.  Neurological:     Mental Status: He is alert.     Comments: Trace reflexes knee and ankle bilaterally.  Good strength lower extremities.    BP (!) 160/90 (BP Location: Left Arm, Patient Position: Sitting, Cuff Size: Normal)   Pulse 85   Temp 98.1 F (36.7 C) (Oral)   Wt 205 lb 12.8 oz (93.4 kg)   SpO2 97%   BMI 27.91 kg/m  Wt Readings from Last 3 Encounters:  07/09/20 205 lb 12.8 oz (93.4 kg)  03/28/19 210 lb 7 oz (95.5 kg)  06/27/17 201 lb 3.2 oz (91.3 kg)     Health Maintenance Due  Topic Date Due   COVID-19 Vaccine (1) Never done   Pneumococcal Vaccine 62-33 Years old (1 - PCV) Never done   HIV Screening   Never done   Hepatitis C Screening  Never done   TETANUS/TDAP  Never done   Zoster Vaccines- Shingrix (1 of 2) Never done   COLONOSCOPY (Pts 45-74yrs Insurance coverage will need to be confirmed)  02/22/2020    There are no preventive care reminders to display for  this patient.  Lab Results  Component Value Date   TSH 2.120 07/13/2013   Lab Results  Component Value Date   WBC 12.6 (H) 04/23/2017   HGB 14.1 04/23/2017   HCT 41.5 04/23/2017   MCV 95.4 04/23/2017   PLT 212 04/23/2017   Lab Results  Component Value Date   NA 139 04/23/2017   K 4.2 04/23/2017   CO2 24 04/23/2017   GLUCOSE 105 (H) 04/23/2017   BUN 13 04/23/2017   CREATININE 0.88 04/23/2017   BILITOT 0.8 04/14/2017   ALKPHOS 14 (L) 04/14/2017   AST 36 04/14/2017   ALT 38 04/14/2017   PROT 7.6 04/14/2017   ALBUMIN 4.3 04/14/2017   CALCIUM 8.7 (L) 04/23/2017   ANIONGAP 9 04/23/2017   Lab Results  Component Value Date   CHOL 204 (H) 07/13/2013   Lab Results  Component Value Date   HDL 62 07/13/2013   Lab Results  Component Value Date   LDLCALC 119 (H) 07/13/2013   Lab Results  Component Value Date   TRIG 113 07/13/2013   Lab Results  Component Value Date   CHOLHDL 3.3 07/13/2013   Lab Results  Component Value Date   HGBA1C 5.0 04/14/2017      Assessment & Plan:   #1 history of moderate persistent asthma.  Ongoing nicotine use.  May have COPD given duration of nicotine use.  Patient relates intermittent wheezing even on Qvar.  We discussed possibly stepping up therapy to Symbicort 2 puffs twice daily. -Set up pulmonary function test to further evaluate -strongly advice d/c smoking.  #2 elevated blood pressure.  Patient currently on carvedilol.  Not monitoring blood pressures at home.  Blood pressure repeat after several minutes rest 152/98  -Strongly recommend home monitor and monitor the next few weeks and if consistently greater than 140/90 be in touch we will consider additional  medication possibly ARB -We did discuss possibly initiating blood pressure medication today but he would like to monitor for a few weeks first  #3 roughly 2-year history of persistent left anterior thigh numbness.  No major weakness.  We suspected diagnosis of meralgia paresthetica given distribution.  He is concerned symptoms have not improved.  Does not sound like they have clearly progressed either.  Consider neurology consult to further assess  Meds ordered this encounter  Medications   budesonide-formoterol (SYMBICORT) 160-4.5 MCG/ACT inhaler    Sig: Inhale 2 puffs into the lungs 2 (two) times daily.    Dispense:  1 each    Refill:  5    Follow-up: No follow-ups on file.    Carolann Littler, MD

## 2020-07-21 ENCOUNTER — Encounter: Payer: Self-pay | Admitting: Neurology

## 2020-07-25 ENCOUNTER — Encounter: Payer: Self-pay | Admitting: Family Medicine

## 2020-08-18 ENCOUNTER — Encounter: Payer: Self-pay | Admitting: Gastroenterology

## 2020-08-25 DIAGNOSIS — Z91030 Bee allergy status: Secondary | ICD-10-CM | POA: Diagnosis not present

## 2020-08-29 ENCOUNTER — Encounter: Payer: Self-pay | Admitting: Gastroenterology

## 2020-09-09 DIAGNOSIS — Z125 Encounter for screening for malignant neoplasm of prostate: Secondary | ICD-10-CM | POA: Diagnosis not present

## 2020-09-09 DIAGNOSIS — E291 Testicular hypofunction: Secondary | ICD-10-CM | POA: Diagnosis not present

## 2020-09-18 ENCOUNTER — Other Ambulatory Visit: Payer: Self-pay | Admitting: Family Medicine

## 2020-10-02 ENCOUNTER — Ambulatory Visit (INDEPENDENT_AMBULATORY_CARE_PROVIDER_SITE_OTHER): Payer: BC Managed Care – PPO | Admitting: Neurology

## 2020-10-02 ENCOUNTER — Other Ambulatory Visit: Payer: Self-pay

## 2020-10-02 ENCOUNTER — Encounter: Payer: Self-pay | Admitting: Neurology

## 2020-10-02 VITALS — BP 161/104 | HR 81 | Ht 72.0 in | Wt 198.0 lb

## 2020-10-02 DIAGNOSIS — G5712 Meralgia paresthetica, left lower limb: Secondary | ICD-10-CM

## 2020-10-02 NOTE — Patient Instructions (Addendum)
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  Unfortunately, there is no treatment for numbness.   Start taking a multivitamin

## 2020-10-02 NOTE — Progress Notes (Signed)
[facility] Clinic Note - Initial Visit   Date: 10/02/20  [name] [name] MRN: [id number] DOB: [birth year]   Dear Dr. Mikey Kirschner:  Thank you for your kind referral of [name] [name] for consultation of left thigh numbness. Although his history is well known to you, please allow [place] to reiterate it for the purpose of our medical record. The patient was accompanied to the clinic by self.    History of Present Illness: [name] [name] is a 55 y.o. right-handed male with asthma, hypertension, and GERD presenting for evaluation of left thigh numbness.   Starting in 2019, he began having shooting pain and numbness over the left lateral thigh.  This shooting pain subsided, however, he has constant numbness over the lateral thigh, which does not extend into the knee, medial thigh, or posterior thigh.  No weakness in the legs.  He does not have similar symptoms on the right leg. He denies wearing tight belts or significant weight gain.   He works as a Music therapist.    He smokes about 1-1.5PPD x 30 years.  He drinking 2-3 cocktails nightly x 15 years.   Out-side paper records, electronic medical record, and images have been reviewed where available and summarized as:  Lab Results  Component Value Date   HGBA1C 5.0 04/14/2017   No results found for: VITAMINB12 Lab Results  Component Value Date   TSH 2.120 07/13/2013   Lab Results  Component Value Date   ESRSEDRATE 4 12/23/2009    Past Medical History:  Diagnosis Date   Asthma    wheezing and cough   Daily headache    Diverticula of colon    Dysrhythmia    Enlarged prostate with lower urinary tract symptoms (LUTS)    GERD 12/23/2009   H/O wheezing    uses inhaler   History of rectal bleeding    Hypertension    IBS (irritable bowel syndrome)    Internal hemorrhoids    Nonsustained ventricular tachycardia ([place]) 06/2013   Pneumonia 10/2012   PVC (premature ventricular contraction)    Serrated  adenoma of colon    Shortness of breath    "at rest" (11/22/2012)    Past Surgical History:  Procedure Laterality Date   COLONOSCOPY W/ BIOPSIES AND POLYPECTOMY  02/21/2017   DIAGNOSTIC LAPAROSCOPY     versus open ileocectomy Dr. Vashti Hey 04-20-17   LAPAROSCOPIC RIGHT HEMI COLECTOMY Right 04/20/2017   Procedure: LAPAROSCOPIC VS OPEN [place];  Surgeon: Ileana Roup, MD;  Location: [facility];  Service: General;  Laterality: Right;   VASECTOMY  1990's     Medications:  Outpatient Encounter Medications as of 10/02/2020  Medication Sig   albuterol (PROVENTIL HFA) 108 (90 Base) MCG/ACT inhaler Inhale 2 puffs into the lungs every 4 (four) hours as needed for shortness of breath.   budesonide-formoterol (SYMBICORT) 160-4.5 MCG/ACT inhaler Inhale 2 puffs into the lungs 2 (two) times daily.   carvedilol (COREG) 6.25 MG tablet TAKE 1 TABLET(6.25 MG) BY MOUTH TWICE DAILY WITH A MEAL   ibuprofen (ADVIL,MOTRIN) 200 MG tablet Take 400-600 mg by mouth daily as needed for headache or moderate pain.   omeprazole (PRILOSEC) 40 MG capsule TAKE 1 CAPSULE BY MOUTH EVERY DAY   oxymetazoline (AFRIN) 0.05 % nasal spray Place 1 spray into both nostrils daily as needed for congestion.   Testosterone 30 MG/ACT SOLN Place 1 application onto the skin daily.   VIAGRA 100 MG tablet Take 100 mg by mouth as  needed for erectile dysfunction.    No facility-administered encounter medications on file as of 10/02/2020.    Allergies: No Known Allergies  Family History: Family History  Problem Relation Age of Onset   Stroke Mother    Colon polyps Father    Kidney failure Father    Stomach cancer Paternal Uncle    Diabetes Maternal Grandmother    Prostate cancer Paternal Grandfather    Stroke Paternal Grandfather    Colon cancer Neg Hx    Esophageal cancer Neg Hx    Rectal cancer Neg Hx    Pancreatic cancer Neg Hx     Social History: Social History   Tobacco Use   Smoking status: Some Days     Packs/day: 1.00    Years: 23.00    Pack years: 23.00    Types: Cigarettes   Smokeless tobacco: Never  Vaping Use   Vaping Use: Never used  Substance Use Topics   Alcohol use: Yes    Alcohol/week: 28.0 standard drinks    Types: 28 Shots of liquor per week    Comment: 11/21/2012 "couple shots in each drink; 2 mixed drinks/night"   Drug use: No   Social History   Social History Narrative   Patient does regular exercise.   Right Handed    Lives in a three story home     Vital Signs:  BP (!) 161/104   Pulse 81   Ht 6' (1.829 m)   Wt 198 lb (89.8 kg)   SpO2 97%   BMI 26.85 kg/m   Neurological Exam: MENTAL STATUS including orientation to time, place, person, recent and remote memory, attention span and concentration, language, and fund of knowledge is normal.  Speech is not dysarthric.  CRANIAL NERVES: II:  No visual field defects.    III-IV-VI: Pupils equal round and reactive to light.  Normal conjugate, extra-ocular eye movements in all directions of gaze.  No nystagmus.  No ptosis.   V:  Normal facial sensation.    VII:  Normal facial symmetry and movements.   VIII:  Normal hearing and vestibular function.   IX-X:  Normal palatal movement.   XI:  Normal shoulder shrug and head rotation.   XII:  Normal tongue strength and range of motion, no deviation or fasciculation.  MOTOR:  No atrophy, fasciculations or abnormal movements.  No pronator drift.   Upper Extremity:  Right  Left  Deltoid  5/5   5/5   Biceps  5/5   5/5   Triceps  5/5   5/5   Infraspinatus 5/5  5/5  Medial pectoralis 5/5  5/5  Wrist extensors  5/5   5/5   Wrist flexors  5/5   5/5   Finger extensors  5/5   5/5   Finger flexors  5/5   5/5   Dorsal interossei  5/5   5/5   Abductor pollicis  5/5   5/5   Tone (Ashworth scale)  0  0   Lower Extremity:  Right  Left  Hip flexors  5/5   5/5   Hip extensors  5/5   5/5   Adductor 5/5  5/5  Abductor 5/5  5/5  Knee flexors  5/5   5/5   Knee extensors   5/5   5/5   Dorsiflexors  5/5   5/5   Plantarflexors  5/5   5/5   Toe extensors  5/5   5/5   Toe flexors  5/5   5/5  Tone (Ashworth scale)  0  0   MSRs:  Right        Left                  brachioradialis 2+  2+  biceps 2+  2+  triceps 2+  2+  patellar 2+  2+  ankle jerk 1+  1+  Hoffman no  no  plantar response down  down   SENSORY:  Reduced temperature and pin prick over the left anterolateral thigh, otherwise, normal and symmetric perception of light touch, pinprick, vibration.  COORDINATION/GAIT: Normal finger-to- nose-finger.  Intact rapid alternating movements bilaterally..  Gait narrow based and stable. Tandem and stressed gait intact.    IMPRESSION: Left meralgia paresthetica manifesting with numbness over the left lateral femoral cutaneous nerve distribution. Given that symptoms are constant for the past 3 year, he will most likely have permament sensory deficits.  Unfortunately, there is no treatment for numbness.  I recommended trying to reduce alcohol and smoking to optimize nerve recovery.    Return to clinic if symptoms get worse   Thank you for allowing me to participate in patient's care.  If I can answer any additional questions, I would be pleased to do so.    Sincerely,    Barbarann Kelly K. Posey Pronto, DO

## 2020-10-04 ENCOUNTER — Other Ambulatory Visit: Payer: Self-pay | Admitting: Family Medicine

## 2020-10-07 DIAGNOSIS — N5201 Erectile dysfunction due to arterial insufficiency: Secondary | ICD-10-CM | POA: Diagnosis not present

## 2020-10-07 DIAGNOSIS — E291 Testicular hypofunction: Secondary | ICD-10-CM | POA: Diagnosis not present

## 2020-10-28 ENCOUNTER — Ambulatory Visit (AMBULATORY_SURGERY_CENTER): Payer: BC Managed Care – PPO | Admitting: *Deleted

## 2020-10-28 ENCOUNTER — Encounter: Payer: Self-pay | Admitting: Gastroenterology

## 2020-10-28 ENCOUNTER — Other Ambulatory Visit: Payer: Self-pay

## 2020-10-28 VITALS — Ht 72.0 in | Wt 200.0 lb

## 2020-10-28 DIAGNOSIS — Z86010 Personal history of colonic polyps: Secondary | ICD-10-CM

## 2020-10-28 MED ORDER — PEG-KCL-NACL-NASULF-NA ASC-C 100 G PO SOLR: 1.0000 | Freq: Once | ORAL | 0 refills | Status: AC

## 2020-10-28 NOTE — Progress Notes (Signed)
No egg or soy allergy known to patient  No issues known to pt with past sedation with any surgeries or procedures Patient denies ever being told they had issues or difficulty with intubation  No FH of Malignant Hyperthermia Pt is not on diet pills Pt is not on  home 02  Pt is not on blood thinners  Pt denies issues with constipation  No A fib or A flutter  EMMI video to pt or via MyChart   Pt is fully vaccinated  for Covid   Due to the COVID-19 pandemic we are asking patients to follow certain guidelines.  Pt aware of COVID protocols and [facility] guidelines  Pt verified name, DOB, address and insurance during PV today.  Pt mailed instruction packet of Emmi video, copy of consent form to read and not return, and instructions.  PV completed over the phone.  Pt encouraged to call with questions or issues.  My Chart instructions to pt as well

## 2020-11-10 ENCOUNTER — Ambulatory Visit (AMBULATORY_SURGERY_CENTER): Payer: BC Managed Care – PPO | Admitting: Gastroenterology

## 2020-11-10 ENCOUNTER — Encounter: Payer: Self-pay | Admitting: Gastroenterology

## 2020-11-10 VITALS — BP 136/91 | HR 65 | Temp 98.2°F | Resp 18 | Ht 72.0 in | Wt 200.0 lb

## 2020-11-10 DIAGNOSIS — Z86010 Personal history of colonic polyps: Secondary | ICD-10-CM | POA: Diagnosis not present

## 2020-11-10 DIAGNOSIS — D123 Benign neoplasm of transverse colon: Secondary | ICD-10-CM

## 2020-11-10 DIAGNOSIS — K635 Polyp of colon: Secondary | ICD-10-CM

## 2020-11-10 DIAGNOSIS — D124 Benign neoplasm of descending colon: Secondary | ICD-10-CM

## 2020-11-10 DIAGNOSIS — Z1211 Encounter for screening for malignant neoplasm of colon: Secondary | ICD-10-CM | POA: Diagnosis not present

## 2020-11-10 MED ORDER — SODIUM CHLORIDE 0.9 % IV SOLN: 500.0000 mL | Freq: Once | INTRAVENOUS | Status: DC

## 2020-11-10 NOTE — Progress Notes (Signed)
Report to PACU, RN, vss, BBS= Clear.  

## 2020-11-10 NOTE — Progress Notes (Signed)
History and Physical:  This patient presents for endoscopic testing for: Encounter Diagnosis  Name Primary?   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  ROS: Patient denies chest pain or cough   Past Medical History: Past Medical History:  Diagnosis Date   Allergy    seasonal   Asthma    wheezing and cough   Daily headache    Diverticula of colon    Dysrhythmia    Enlarged prostate with lower urinary tract symptoms (LUTS)    GERD 12/23/2009   H/O wheezing    uses inhaler   History of rectal bleeding    Hypertension    IBS (irritable bowel syndrome)    Internal hemorrhoids    Nonsustained ventricular tachycardia 06/2013   Pneumonia 10/2012   PVC (premature ventricular contraction)    Serrated adenoma of colon    Shortness of breath    "at rest" (11/22/2012)     Past Surgical History: Past Surgical History:  Procedure Laterality Date   COLONOSCOPY     COLONOSCOPY W/ BIOPSIES AND POLYPECTOMY  02/21/2017   DIAGNOSTIC LAPAROSCOPY     versus open ileocectomy Dr. Vashti Hey 04-20-17   LAPAROSCOPIC RIGHT HEMI COLECTOMY Right 04/20/2017   Procedure: LAPAROSCOPIC VS OPEN [place];  Surgeon: Ileana Roup, MD;  Location: [facility];  Service: General;  Laterality: Right;   POLYPECTOMY     VASECTOMY  1990's    Allergies: No Known Allergies  Outpatient Meds: Current Outpatient Medications  Medication Sig Dispense Refill   budesonide-formoterol (SYMBICORT) 160-4.5 MCG/ACT inhaler Inhale 2 puffs into the lungs 2 (two) times daily. 1 each 5   carvedilol (COREG) 6.25 MG tablet TAKE 1 TABLET(6.25 MG) BY MOUTH TWICE DAILY WITH A MEAL 180 tablet 0   omeprazole (PRILOSEC) 40 MG capsule TAKE 1 CAPSULE BY MOUTH EVERY DAY 90 capsule 0   oxymetazoline (AFRIN) 0.05 % nasal spray Place 1 spray into both nostrils daily as needed for congestion.     albuterol (PROVENTIL HFA) 108 (90 Base) MCG/ACT inhaler Inhale 2 puffs into the  lungs every 4 (four) hours as needed for shortness of breath. 18 g 2   ibuprofen (ADVIL,MOTRIN) 200 MG tablet Take 400-600 mg by mouth daily as needed for headache or moderate pain.     Testosterone 30 MG/ACT SOLN Place 1 application onto the skin daily.     VIAGRA 100 MG tablet Take 100 mg by mouth as needed for erectile dysfunction.      Current Facility-Administered Medications  Medication Dose Route Frequency Provider Last Rate Last Admin   0.9 %  sodium chloride infusion  500 mL Intravenous Once Nelida Meuse III, MD          ___________________________________________________________________ Objective   Exam:  BP (!) 144/94   Pulse 64   Temp 98.2 F (36.8 C) (Temporal)   Resp 18   Ht 6' (1.829 m)   Wt 200 lb (90.7 kg)   SpO2 99%   BMI 27.12 kg/m   CV: RRR without murmur, S1/S2 Resp: clear to auscultation bilaterally, normal RR and effort noted GI: soft, no tenderness, with active bowel sounds.   Assessment: Encounter Diagnosis  Name Primary?   Personal history of colonic polyps Yes     Plan: Colonoscopy  The benefits and risks of the planned procedure were described in detail with the patient or (when appropriate) their health care proxy.  Risks were outlined as including, but not  limited to, bleeding, infection, perforation, adverse medication reaction leading to cardiac or pulmonary decompensation, pancreatitis (if ERCP).  The limitation of incomplete mucosal visualization was also discussed.  No guarantees or warranties were given.    The patient is appropriate for an endoscopic procedure in the ambulatory setting.   - Wilfrid Lund, MD

## 2020-11-10 NOTE — Progress Notes (Signed)
Pt's states no medical or surgical changes since previsit or office visit. 

## 2020-11-10 NOTE — Progress Notes (Signed)
Called to room to assist during endoscopic procedure.  Patient ID and intended procedure confirmed with present staff. Received instructions for my participation in the procedure from the performing physician.  

## 2020-11-10 NOTE — Op Note (Addendum)
[name] [name]: [name] [name] Procedure Date: 11/10/2020 9:46 AM MRN: [id number] Endoscopist: Mallie Mussel L. 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 The patient's tolerance of                            previous anesthesia was also reviewed. The risks                            and benefits of the procedure and the sedation                            options and risks were discussed with the patient.                            All questions were answered, and informed consent                            was obtained. Prior Anticoagulants: The patient has                            taken no previous anticoagulant or antiplatelet                            agents. ASA Grade Assessment: II - A patient with  mild systemic disease. After reviewing the risks                            and benefits, the patient was deemed in                            satisfactory condition to undergo the procedure.                           After obtaining informed consent, the colonoscope                            was passed under direct vision. Throughout the                            procedure, the patient's blood pressure, pulse, and                             oxygen saturations were monitored continuously. The                            Olympus CF-HQ190L ([id number]) Colonoscope was                            introduced through the anus and advanced to the the                            ileocolonic anastomosis. The colonoscopy was                            performed without difficulty. The patient tolerated                            the procedure well. The quality of the bowel                            preparation was good after lavage. The rectum and                            ileo-colonic anastomosis were photographed. The                            bowel preparation used was MoviPrep. Scope In: 9:56:04 AM Scope Out: 10:14:17 AM Scope Withdrawal Time: 0 hours 14 minutes 35 seconds  Total Procedure Duration: 0 hours 18 minutes 13 seconds  Findings:                 The perianal and digital rectal examinations were                            normal.                           There was evidence of a prior side-to-side  ileo-colonic anastomosis at the hepatic flexure.                            This was patent and was characterized by healthy                            appearing mucosa.                           Three sessile polyps were found in the descending                            colon and transverse colon. The polyps were 2 to 6                            mm in size. These polyps were removed with a cold                            snare. Resection and retrieval were complete.                           Internal hemorrhoids were found. The hemorrhoids                            were small.                           The exam was otherwise without abnormality on                            direct and retroflexion views. Complications:            No immediate complications. Estimated Blood Loss:     Estimated blood loss was minimal. Impression:               - Patent side-to-side ileo-colonic  anastomosis,                            characterized by healthy appearing mucosa.                           - Three 2 to 6 mm polyps in the descending colon                            and in the transverse colon, removed with a cold                            snare. Resected and retrieved.                           - Internal hemorrhoids.                           - The examination was otherwise normal on direct  and retroflexion views. Recommendation:           - Patient has a contact number available for                            emergencies. The signs and symptoms of potential                            delayed complications were discussed with the                            patient. Return to normal activities tomorrow.                            Written discharge instructions were provided to the                            patient.                           - Resume previous diet.                           - Continue present medications.                           - Await pathology results.                           - Repeat colonoscopy is recommended for                            surveillance. The colonoscopy date will be                            determined after pathology results from today's                            exam become available for review. (Suprep or Plenvu                            for next exam) [name] [name] L. Loletha Carrow, MD 11/10/2020 10:20:54 AM This report has been signed electronically.

## 2020-11-10 NOTE — Patient Instructions (Signed)
Handouts given on polyps and hemorrhoids.  YOU HAD AN ENDOSCOPIC PROCEDURE TODAY AT [facility]:   Refer to the procedure report that was given to you for any specific questions about what was found during the examination.  If the procedure report does not answer your questions, please call your gastroenterologist to clarify.  If you requested that your care partner not be given the details of your procedure findings, then the procedure report has been included in a sealed envelope for you to review at your convenience later.  YOU SHOULD EXPECT: Some feelings of bloating in the abdomen. Passage of more gas than usual.  Walking can help get rid of the air that was put into your GI tract during the procedure and reduce the bloating. If you had a lower endoscopy (such as a colonoscopy or flexible sigmoidoscopy) you may notice spotting of blood in your stool or on the toilet paper. If you underwent a bowel prep for your procedure, you may not have a normal bowel movement for a few days.  Please Note:  You might notice some irritation and congestion in your nose or some drainage.  This is from the oxygen used during your procedure.  There is no need for concern and it should clear up in a day or so.  SYMPTOMS TO REPORT IMMEDIATELY:  Following lower endoscopy (colonoscopy or flexible sigmoidoscopy):  Excessive amounts of blood in the stool  Significant tenderness or worsening of abdominal pains  Swelling of the abdomen that is new, acute  Fever of 100F or higher   For urgent or emergent issues, a gastroenterologist can be reached at any hour by calling [phone]. Do not use MyChart messaging for urgent concerns.    DIET:  We do recommend a small meal at first, but then you may proceed to your regular diet.  Drink plenty of fluids but you should avoid alcoholic beverages for 24 hours.  ACTIVITY:  You should plan to take it easy for the rest of today and you should NOT DRIVE or  use heavy machinery until tomorrow (because of the sedation medicines used during the test).    FOLLOW UP: Our staff will call the number listed on your records 48-72 hours following your procedure to check on you and address any questions or concerns that you may have regarding the information given to you following your procedure. If we do not reach you, we will leave a message.  We will attempt to reach you two times.  During this call, we will ask if you have developed any symptoms of COVID 19. If you develop any symptoms (ie: fever, flu-like symptoms, shortness of breath, cough etc.) before then, please call [phone].  If you test positive for Covid 19 in the 2 weeks post procedure, please call and report this information to [place].    If any biopsies were taken you will be contacted by phone or by letter within the next 1-3 weeks.  Please call us at [phone] if you have not heard about the biopsies in 3 weeks.    SIGNATURES/CONFIDENTIALITY: You and/or your care partner have signed paperwork which will be entered into your electronic medical record.  These signatures attest to the fact that that the information above on your After Visit Summary has been reviewed and is understood.  Full responsibility of the confidentiality of this discharge information lies with you and/or your care-partner.

## 2020-11-12 ENCOUNTER — Telehealth: Payer: Self-pay

## 2020-11-12 NOTE — Telephone Encounter (Signed)
No answer, unable to leave a message, B.Milca Sytsma RN. 

## 2020-11-12 NOTE — Telephone Encounter (Signed)
Second post procedure follow up call, no answer 

## 2020-11-13 ENCOUNTER — Encounter: Payer: Self-pay | Admitting: Gastroenterology

## 2021-01-14 ENCOUNTER — Other Ambulatory Visit: Payer: Self-pay | Admitting: Family Medicine

## 2021-01-27 ENCOUNTER — Ambulatory Visit (INDEPENDENT_AMBULATORY_CARE_PROVIDER_SITE_OTHER): Payer: BC Managed Care – PPO | Admitting: Family Medicine

## 2021-01-27 VITALS — BP 170/108 | HR 77 | Temp 98.0°F | Wt 212.4 lb

## 2021-01-27 DIAGNOSIS — I10 Essential (primary) hypertension: Secondary | ICD-10-CM | POA: Insufficient documentation

## 2021-01-27 DIAGNOSIS — I471 Supraventricular tachycardia: Secondary | ICD-10-CM | POA: Diagnosis not present

## 2021-01-27 MED ORDER — CARVEDILOL 6.25 MG PO TABS: ORAL_TABLET | ORAL | 3 refills | Status: DC

## 2021-01-27 MED ORDER — OLMESARTAN MEDOXOMIL 20 MG PO TABS: 20.0000 mg | ORAL_TABLET | Freq: Every day | ORAL | 1 refills | Status: DC

## 2021-01-27 NOTE — Progress Notes (Signed)
Established Patient Office Visit  Subjective:  Patient ID: [name] [name], male    DOB: [birth year]  Age: 56 y.o. MRN: [id number]  CC:  Chief Complaint  Patient presents with   Medication Refill    HPI [name] [name] presents for medication refill.  He has past history of nonsustained PSVT.  He is on carvedilol 6.25 mg twice daily.  Does have history of borderline elevated blood pressures in the past.  By home cuff average readings 141/94.  He has not been exercising regularly.  No recent headaches or chest pains.  Does drink vodka daily usually 2-3 drinks which apparently represents several ounces per drink.  [name] [name] has gained some weight during the past year  His father passed away during the past year apparently complications of COVID and apparently had chronic kidney disease of uncertain etiology.   Past Medical History:  Diagnosis Date   Allergy    seasonal   Asthma    wheezing and cough   Daily headache    Diverticula of colon    Dysrhythmia    Enlarged prostate with lower urinary tract symptoms (LUTS)    GERD 12/23/2009   H/O wheezing    uses inhaler   History of rectal bleeding    Hypertension    IBS (irritable bowel syndrome)    Internal hemorrhoids    Nonsustained ventricular tachycardia 06/2013   Pneumonia 10/2012   PVC (premature ventricular contraction)    Serrated adenoma of colon    Shortness of breath    "at rest" (11/22/2012)    Past Surgical History:  Procedure Laterality Date   COLONOSCOPY     COLONOSCOPY W/ BIOPSIES AND POLYPECTOMY  02/21/2017   DIAGNOSTIC LAPAROSCOPY     versus open ileocectomy Dr. Vashti Hey 04-20-17   LAPAROSCOPIC RIGHT HEMI COLECTOMY Right 04/20/2017   Procedure: LAPAROSCOPIC VS OPEN [place];  Surgeon: Ileana Roup, MD;  Location: [facility];  Service: General;  Laterality: Right;   POLYPECTOMY     VASECTOMY  55's    Family History  Problem Relation Age of Onset   Stroke Mother    Colon polyps Father     Kidney failure Father    Stomach cancer Paternal Uncle    Diabetes Maternal Grandmother    Prostate cancer Paternal Grandfather    Stroke Paternal Grandfather    Colon cancer Neg Hx    Esophageal cancer Neg Hx    Rectal cancer Neg Hx    Pancreatic cancer Neg Hx     Social History   Socioeconomic History   Marital status: Married    Spouse name: Not on file   Number of children: 3   Years of education: Not on file   Highest education level: Not on file  Occupational History   Occupation: Music therapist    Employer: [employer]   Occupation: OWNER    Employer: [employer]  Tobacco Use   Smoking status: Every Day    Packs/day: 1.00    Years: 23.00    Pack years: 23.00    Types: Cigarettes   Smokeless tobacco: Never  Vaping Use   Vaping Use: Never used  Substance and Sexual Activity   Alcohol use: Yes    Alcohol/week: 28.0 standard drinks    Types: 28 Shots of liquor per week    Comment: 11/21/2012 "couple shots in each drink; 2 mixed drinks/night"   Drug use: No   Sexual activity: Yes  Other Topics Concern  Not on file  Social History Narrative   Patient does regular exercise.   Right Handed    Lives in a three story home    Social Determinants of Health   Financial Resource Strain: Not on file  Food Insecurity: Not on file  Transportation Needs: Not on file  Physical Activity: Not on file  Stress: Not on file  Social Connections: Not on file  Intimate Partner Violence: Not on file    Outpatient Medications Prior to Visit  Medication Sig Dispense Refill   albuterol (PROVENTIL HFA) 108 (90 Base) MCG/ACT inhaler Inhale 2 puffs into the lungs every 4 (four) hours as needed for shortness of breath. 18 g 2   budesonide-formoterol (SYMBICORT) 160-4.5 MCG/ACT inhaler Inhale 2 puffs into the lungs 2 (two) times daily. 1 each 5   ibuprofen (ADVIL,MOTRIN) 200 MG tablet Take 400-600 mg by mouth daily as needed for headache or moderate pain.      omeprazole (PRILOSEC) 40 MG capsule TAKE 1 CAPSULE BY MOUTH EVERY DAY 90 capsule 0   oxymetazoline (AFRIN) 0.05 % nasal spray Place 1 spray into both nostrils daily as needed for congestion.     Testosterone 30 MG/ACT SOLN Place 1 application onto the skin daily.     VIAGRA 100 MG tablet Take 100 mg by mouth as needed for erectile dysfunction.      carvedilol (COREG) 6.25 MG tablet TAKE 1 TABLET(6.25 MG) BY MOUTH TWICE DAILY WITH A MEAL 180 tablet 0   No facility-administered medications prior to visit.    No Known Allergies  ROS Review of Systems  Constitutional:  Negative for fatigue.  Eyes:  Negative for visual disturbance.  Respiratory:  Negative for cough, chest tightness and shortness of breath.   Cardiovascular:  Negative for chest pain, palpitations and leg swelling.  Neurological:  Negative for dizziness, syncope, weakness, light-headedness and headaches.     Objective:    Physical Exam Constitutional:      Appearance: He is well-developed.  HENT:     Right Ear: External ear normal.     Left Ear: External ear normal.  Eyes:     Pupils: Pupils are equal, round, and reactive to light.  Neck:     Thyroid: No thyromegaly.  Cardiovascular:     Rate and Rhythm: Normal rate and regular rhythm.  Pulmonary:     Effort: Pulmonary effort is normal. No respiratory distress.     Breath sounds: Normal breath sounds. No wheezing or rales.  Musculoskeletal:     Cervical back: Neck supple.  Neurological:     Mental Status: He is alert.    BP (!) 170/108 (BP Location: Left Arm, Cuff Size: Normal)    Pulse 77    Temp 98 F (36.7 C) (Oral)    Wt 212 lb 6.4 oz (96.3 kg)    SpO2 97%    BMI 28.81 kg/m  Wt Readings from Last 3 Encounters:  01/27/21 212 lb 6.4 oz (96.3 kg)  11/10/20 200 lb (90.7 kg)  10/28/20 200 lb (90.7 kg)     Health Maintenance Due  Topic Date Due   COVID-19 Vaccine (1) Never done   Pneumococcal Vaccine 57-93 Years old (1 - PCV) Never done   HIV  Screening  Never done   Hepatitis C Screening  Never done   TETANUS/TDAP  Never done   Zoster Vaccines- Shingrix (1 of 2) Never done   INFLUENZA VACCINE  08/25/2020    There are no preventive care reminders  to display for this patient.  Lab Results  Component Value Date   TSH 2.120 07/13/2013   Lab Results  Component Value Date   WBC 12.6 (H) 04/23/2017   HGB 14.1 04/23/2017   HCT 41.5 04/23/2017   MCV 95.4 04/23/2017   PLT 212 04/23/2017   Lab Results  Component Value Date   NA 139 04/23/2017   K 4.2 04/23/2017   CO2 24 04/23/2017   GLUCOSE 105 (H) 04/23/2017   BUN 13 04/23/2017   CREATININE 0.88 04/23/2017   BILITOT 0.8 04/14/2017   ALKPHOS 14 (L) 04/14/2017   AST 36 04/14/2017   ALT 38 04/14/2017   PROT 7.6 04/14/2017   ALBUMIN 4.3 04/14/2017   CALCIUM 8.7 (L) 04/23/2017   ANIONGAP 9 04/23/2017   Lab Results  Component Value Date   CHOL 204 (H) 07/13/2013   Lab Results  Component Value Date   HDL 62 07/13/2013   Lab Results  Component Value Date   LDLCALC 119 (H) 07/13/2013   Lab Results  Component Value Date   TRIG 113 07/13/2013   Lab Results  Component Value Date   CHOLHDL 3.3 07/13/2013   Lab Results  Component Value Date   HGBA1C 5.0 04/14/2017      Assessment & Plan:   #1 essential hypertension.  Repeat reading left arm seated after rest 178/108.  Recent risk factors of weight gain and excessive alcohol consumption.  -We discussed nonpharmacologic factors and I recommend try to lose some weight, scale back alcohol use, keep sodium intake less than 25 mg daily, and reestablish more consistent aerobic exercise -Start Benicar 20 mg once daily -Reassess 3 weeks.  May need combination therapy with Benicar HCTZ or chlorthalidone if not closer to goal  #2 history of PSVT.  No recent palpitations.  Refill carvedilol for 1 year.   Meds ordered this encounter  Medications   carvedilol (COREG) 6.25 MG tablet    Sig: TAKE 1 TABLET(6.25 MG) BY  MOUTH TWICE DAILY WITH A MEAL    Dispense:  180 tablet    Refill:  3   olmesartan (BENICAR) 20 MG tablet    Sig: Take 1 tablet (20 mg total) by mouth daily.    Dispense:  30 tablet    Refill:  1    Follow-up: Return in about 3 weeks (around 02/17/2021).    Carolann Littler, MD

## 2021-02-17 ENCOUNTER — Ambulatory Visit (INDEPENDENT_AMBULATORY_CARE_PROVIDER_SITE_OTHER): Payer: BC Managed Care – PPO | Admitting: Family Medicine

## 2021-02-17 VITALS — BP 146/94 | HR 80 | Temp 98.2°F | Wt 210.6 lb

## 2021-02-17 DIAGNOSIS — I10 Essential (primary) hypertension: Secondary | ICD-10-CM | POA: Diagnosis not present

## 2021-02-17 MED ORDER — OLMESARTAN MEDOXOMIL 20 MG PO TABS: 20.0000 mg | ORAL_TABLET | Freq: Every day | ORAL | 3 refills | Status: DC

## 2021-02-17 NOTE — Patient Instructions (Addendum)
Continue weight loss efforts  Continue to scale back alcohol  Gradually increase exercise.  Goal of at least 150 minutes weekly of moderate physical activity  Let's plan on 3 month follow up and if not closer to goal (< 140/90) at that time will adjust medication.

## 2021-02-17 NOTE — Progress Notes (Signed)
Established Patient Office Visit  Subjective:  Patient ID: [name] [name], male    DOB: September 20, 1965  Age: 56 y.o. MRN: [id number]  CC:  Chief Complaint  Patient presents with   Follow-up    HPI [name] [name] presents for follow-up hypertension.  Refer to last note for details.  We had a Benicar 20 mg daily.  He is taking this consistently with no side effects.  Home blood pressures have varied greatly but have definitely improved some.  He had blood pressure reading yesterday 122/79.  Has had occasional readings up around 710G systolic and 26R diastolic.  He started exercising 2 days/week.  Has lost couple pounds.  He has scaled back vodka use but still drinking most days.  He plans to increase exercise soon.  Has scaled back eating out.  Past Medical History:  Diagnosis Date   Allergy    seasonal   Asthma    wheezing and cough   Daily headache    Diverticula of colon    Dysrhythmia    Enlarged prostate with lower urinary tract symptoms (LUTS)    GERD 12/23/2009   H/O wheezing    uses inhaler   History of rectal bleeding    Hypertension    IBS (irritable bowel syndrome)    Internal hemorrhoids    Nonsustained ventricular tachycardia 06/2013   Pneumonia 10/2012   PVC (premature ventricular contraction)    Serrated adenoma of colon    Shortness of breath    "at rest" (11/22/2012)    Past Surgical History:  Procedure Laterality Date   COLONOSCOPY     COLONOSCOPY W/ BIOPSIES AND POLYPECTOMY  02/21/2017   DIAGNOSTIC LAPAROSCOPY     versus open ileocectomy Dr. Vashti Hey 04-20-17   LAPAROSCOPIC RIGHT HEMI COLECTOMY Right 04/20/2017   Procedure: LAPAROSCOPIC VS OPEN [place];  Surgeon: Ileana Roup, MD;  Location: [facility];  Service: General;  Laterality: Right;   POLYPECTOMY     VASECTOMY  69's    Family History  Problem Relation Age of Onset   Stroke Mother    Colon polyps Father    Kidney failure Father    Stomach cancer Paternal Uncle     Diabetes Maternal Grandmother    Prostate cancer Paternal Grandfather    Stroke Paternal Grandfather    Colon cancer Neg Hx    Esophageal cancer Neg Hx    Rectal cancer Neg Hx    Pancreatic cancer Neg Hx     Social History   Socioeconomic History   Marital status: Married    Spouse name: Not on file   Number of children: 3   Years of education: Not on file   Highest education level: Not on file  Occupational History   Occupation: Music therapist    Employer: [employer]   Occupation: OWNER    Employer: [employer]  Tobacco Use   Smoking status: Every Day    Packs/day: 1.00    Years: 23.00    Pack years: 23.00    Types: Cigarettes   Smokeless tobacco: Never  Vaping Use   Vaping Use: Never used  Substance and Sexual Activity   Alcohol use: Yes    Alcohol/week: 28.0 standard drinks    Types: 28 Shots of liquor per week    Comment: 11/21/2012 "couple shots in each drink; 2 mixed drinks/night"   Drug use: No   Sexual activity: Yes  Other Topics Concern   Not on file  Social  History Narrative   Patient does regular exercise.   Right Handed    Lives in a three story home    Social Determinants of Health   Financial Resource Strain: Not on file  Food Insecurity: Not on file  Transportation Needs: Not on file  Physical Activity: Not on file  Stress: Not on file  Social Connections: Not on file  Intimate Partner Violence: Not on file    Outpatient Medications Prior to Visit  Medication Sig Dispense Refill   albuterol (PROVENTIL HFA) 108 (90 Base) MCG/ACT inhaler Inhale 2 puffs into the lungs every 4 (four) hours as needed for shortness of breath. 18 g 2   budesonide-formoterol (SYMBICORT) 160-4.5 MCG/ACT inhaler Inhale 2 puffs into the lungs 2 (two) times daily. 1 each 5   carvedilol (COREG) 6.25 MG tablet TAKE 1 TABLET(6.25 MG) BY MOUTH TWICE DAILY WITH A MEAL 180 tablet 3   ibuprofen (ADVIL,MOTRIN) 200 MG tablet Take 400-600 mg by mouth  daily as needed for headache or moderate pain.     omeprazole (PRILOSEC) 40 MG capsule TAKE 1 CAPSULE BY MOUTH EVERY DAY 90 capsule 0   oxymetazoline (AFRIN) 0.05 % nasal spray Place 1 spray into both nostrils daily as needed for congestion.     Testosterone 30 MG/ACT SOLN Place 1 application onto the skin daily.     VIAGRA 100 MG tablet Take 100 mg by mouth as needed for erectile dysfunction.      olmesartan (BENICAR) 20 MG tablet Take 1 tablet (20 mg total) by mouth daily. 30 tablet 1   No facility-administered medications prior to visit.    No Known Allergies  ROS Review of Systems  Constitutional:  Negative for fatigue and unexpected weight change.  Eyes:  Negative for visual disturbance.  Respiratory:  Negative for cough, chest tightness and shortness of breath.   Cardiovascular:  Negative for chest pain, palpitations and leg swelling.  Neurological:  Negative for dizziness, syncope, weakness, light-headedness and headaches.     Objective:    Physical Exam Constitutional:      Appearance: He is well-developed.  HENT:     Right Ear: External ear normal.     Left Ear: External ear normal.  Eyes:     Pupils: Pupils are equal, round, and reactive to light.  Neck:     Thyroid: No thyromegaly.  Cardiovascular:     Rate and Rhythm: Normal rate and regular rhythm.  Pulmonary:     Effort: Pulmonary effort is normal. No respiratory distress.     Breath sounds: Normal breath sounds. No wheezing or rales.  Musculoskeletal:     Cervical back: Neck supple.     Right lower leg: No edema.     Left lower leg: No edema.  Neurological:     Mental Status: He is alert and oriented to person, place, and time.    BP (!) 146/94 (BP Location: Left Arm, Cuff Size: Normal)    Pulse 80    Temp 98.2 F (36.8 C) (Oral)    Wt 210 lb 9.6 oz (95.5 kg)    SpO2 97%    BMI 28.56 kg/m  Wt Readings from Last 3 Encounters:  02/17/21 210 lb 9.6 oz (95.5 kg)  01/27/21 212 lb 6.4 oz (96.3 kg)   11/10/20 200 lb (90.7 kg)     Health Maintenance Due  Topic Date Due   COVID-19 Vaccine (1) Never done   HIV Screening  Never done   Hepatitis C Screening  Never done   TETANUS/TDAP  Never done   Zoster Vaccines- Shingrix (1 of 2) Never done   INFLUENZA VACCINE  08/25/2020    There are no preventive care reminders to display for this patient.  Lab Results  Component Value Date   TSH 2.120 07/13/2013   Lab Results  Component Value Date   WBC 12.6 (H) 04/23/2017   HGB 14.1 04/23/2017   HCT 41.5 04/23/2017   MCV 95.4 04/23/2017   PLT 212 04/23/2017   Lab Results  Component Value Date   NA 139 04/23/2017   K 4.2 04/23/2017   CO2 24 04/23/2017   GLUCOSE 105 (H) 04/23/2017   BUN 13 04/23/2017   CREATININE 0.88 04/23/2017   BILITOT 0.8 04/14/2017   ALKPHOS 14 (L) 04/14/2017   AST 36 04/14/2017   ALT 38 04/14/2017   PROT 7.6 04/14/2017   ALBUMIN 4.3 04/14/2017   CALCIUM 8.7 (L) 04/23/2017   ANIONGAP 9 04/23/2017   Lab Results  Component Value Date   CHOL 204 (H) 07/13/2013   Lab Results  Component Value Date   HDL 62 07/13/2013   Lab Results  Component Value Date   LDLCALC 119 (H) 07/13/2013   Lab Results  Component Value Date   TRIG 113 07/13/2013   Lab Results  Component Value Date   CHOLHDL 3.3 07/13/2013   Lab Results  Component Value Date   HGBA1C 5.0 04/14/2017      Assessment & Plan:   Problem List Items Addressed This Visit       Unprioritized   Essential hypertension - Primary   Relevant Medications   olmesartan (BENICAR) 20 MG tablet  Hypertension is improved some compared to last visit.  I obtained reading though 146/94.  He brought in his cuff today and he actually had high reading with his cuff.  -We discussed options.  We decided to give this 3 more months of lifestyle modification with weight loss, increasing exercise, continued scaling back alcohol.  If blood pressure not consistently less than 140/90 at follow-up visit in 3  months we will consider either increasing Benicar or addition of low-dose HCTZ.  Meds ordered this encounter  Medications   olmesartan (BENICAR) 20 MG tablet    Sig: Take 1 tablet (20 mg total) by mouth daily.    Dispense:  90 tablet    Refill:  3    Follow-up: Return in about 3 months (around 05/18/2021).    Carolann Littler, MD

## 2021-02-27 ENCOUNTER — Other Ambulatory Visit: Payer: Self-pay | Admitting: Family Medicine

## 2021-03-23 DIAGNOSIS — M25562 Pain in left knee: Secondary | ICD-10-CM | POA: Diagnosis not present

## 2021-04-13 ENCOUNTER — Other Ambulatory Visit: Payer: Self-pay | Admitting: Family Medicine

## 2021-04-13 DIAGNOSIS — M25562 Pain in left knee: Secondary | ICD-10-CM | POA: Diagnosis not present

## 2021-04-23 DIAGNOSIS — M25562 Pain in left knee: Secondary | ICD-10-CM | POA: Diagnosis not present

## 2021-04-29 DIAGNOSIS — M25562 Pain in left knee: Secondary | ICD-10-CM | POA: Diagnosis not present

## 2021-05-14 DIAGNOSIS — S83242D Other tear of medial meniscus, current injury, left knee, subsequent encounter: Secondary | ICD-10-CM | POA: Diagnosis not present

## 2021-05-18 ENCOUNTER — Ambulatory Visit: Payer: BC Managed Care – PPO | Admitting: Family Medicine

## 2021-05-20 DIAGNOSIS — X58XXXA Exposure to other specified factors, initial encounter: Secondary | ICD-10-CM | POA: Diagnosis not present

## 2021-05-20 DIAGNOSIS — Y999 Unspecified external cause status: Secondary | ICD-10-CM | POA: Diagnosis not present

## 2021-05-20 DIAGNOSIS — M659 Synovitis and tenosynovitis, unspecified: Secondary | ICD-10-CM | POA: Diagnosis not present

## 2021-05-20 DIAGNOSIS — M65862 Other synovitis and tenosynovitis, left lower leg: Secondary | ICD-10-CM | POA: Diagnosis not present

## 2021-05-20 DIAGNOSIS — M2342 Loose body in knee, left knee: Secondary | ICD-10-CM | POA: Diagnosis not present

## 2021-05-20 DIAGNOSIS — M7122 Synovial cyst of popliteal space [Baker], left knee: Secondary | ICD-10-CM | POA: Diagnosis not present

## 2021-05-20 DIAGNOSIS — S83232A Complex tear of medial meniscus, current injury, left knee, initial encounter: Secondary | ICD-10-CM | POA: Diagnosis not present

## 2021-05-20 DIAGNOSIS — M94262 Chondromalacia, left knee: Secondary | ICD-10-CM | POA: Diagnosis not present

## 2021-05-20 DIAGNOSIS — G8918 Other acute postprocedural pain: Secondary | ICD-10-CM | POA: Diagnosis not present

## 2021-05-27 ENCOUNTER — Ambulatory Visit: Payer: BC Managed Care – PPO | Admitting: Family Medicine

## 2021-05-27 DIAGNOSIS — M25562 Pain in left knee: Secondary | ICD-10-CM | POA: Diagnosis not present

## 2021-05-28 ENCOUNTER — Other Ambulatory Visit: Payer: Self-pay | Admitting: Family Medicine

## 2021-05-28 ENCOUNTER — Ambulatory Visit (HOSPITAL_COMMUNITY)
Admission: RE | Admit: 2021-05-28 | Discharge: 2021-05-28 | Disposition: A | Discharge status: Home / Self Care | Payer: BC Managed Care – PPO | Source: Ambulatory Visit | Attending: Cardiovascular Disease | Admitting: Cardiovascular Disease

## 2021-05-28 ENCOUNTER — Other Ambulatory Visit (HOSPITAL_COMMUNITY): Payer: Self-pay | Admitting: Orthopedic Surgery

## 2021-05-28 DIAGNOSIS — M79605 Pain in left leg: Secondary | ICD-10-CM | POA: Diagnosis not present

## 2021-05-28 DIAGNOSIS — M79662 Pain in left lower leg: Secondary | ICD-10-CM

## 2021-06-02 DIAGNOSIS — M25562 Pain in left knee: Secondary | ICD-10-CM | POA: Diagnosis not present

## 2021-06-05 DIAGNOSIS — M25562 Pain in left knee: Secondary | ICD-10-CM | POA: Diagnosis not present

## 2021-06-09 DIAGNOSIS — M25562 Pain in left knee: Secondary | ICD-10-CM | POA: Diagnosis not present

## 2021-06-12 DIAGNOSIS — M25562 Pain in left knee: Secondary | ICD-10-CM | POA: Diagnosis not present

## 2021-06-18 DIAGNOSIS — M25562 Pain in left knee: Secondary | ICD-10-CM | POA: Diagnosis not present

## 2021-06-23 DIAGNOSIS — M25562 Pain in left knee: Secondary | ICD-10-CM | POA: Diagnosis not present

## 2021-06-26 DIAGNOSIS — M25562 Pain in left knee: Secondary | ICD-10-CM | POA: Diagnosis not present

## 2021-07-16 DIAGNOSIS — Z4789 Encounter for other orthopedic aftercare: Secondary | ICD-10-CM | POA: Diagnosis not present

## 2021-07-20 ENCOUNTER — Other Ambulatory Visit: Payer: Self-pay | Admitting: Family Medicine

## 2021-08-25 ENCOUNTER — Other Ambulatory Visit: Payer: Self-pay | Admitting: Family Medicine

## 2021-10-02 DIAGNOSIS — E291 Testicular hypofunction: Secondary | ICD-10-CM | POA: Diagnosis not present

## 2021-10-02 DIAGNOSIS — Z125 Encounter for screening for malignant neoplasm of prostate: Secondary | ICD-10-CM | POA: Diagnosis not present

## 2021-10-09 DIAGNOSIS — E291 Testicular hypofunction: Secondary | ICD-10-CM | POA: Diagnosis not present

## 2021-10-09 DIAGNOSIS — N5201 Erectile dysfunction due to arterial insufficiency: Secondary | ICD-10-CM | POA: Diagnosis not present

## 2021-10-13 ENCOUNTER — Other Ambulatory Visit: Payer: Self-pay | Admitting: Family Medicine

## 2021-11-23 ENCOUNTER — Other Ambulatory Visit: Payer: Self-pay | Admitting: Family Medicine

## 2022-01-12 ENCOUNTER — Other Ambulatory Visit: Payer: Self-pay | Admitting: Family Medicine

## 2022-01-17 ENCOUNTER — Other Ambulatory Visit: Payer: Self-pay | Admitting: Family Medicine

## 2022-01-18 NOTE — Telephone Encounter (Signed)
OV notes from 01/27/21 below: #2 history of PSVT. No recent palpitations. Refill carvedilol for 1 year.   No future appt scheduled

## 2022-02-25 ENCOUNTER — Other Ambulatory Visit: Payer: Self-pay | Admitting: Family Medicine

## 2022-03-25 ENCOUNTER — Other Ambulatory Visit: Payer: Self-pay | Admitting: Family Medicine

## 2022-03-30 ENCOUNTER — Other Ambulatory Visit: Payer: Self-pay | Admitting: Family Medicine

## 2022-03-31 ENCOUNTER — Encounter: Payer: Self-pay | Admitting: Family Medicine

## 2022-03-31 ENCOUNTER — Ambulatory Visit (INDEPENDENT_AMBULATORY_CARE_PROVIDER_SITE_OTHER): Payer: BC Managed Care – PPO | Admitting: Family Medicine

## 2022-03-31 VITALS — BP 150/86 | HR 95 | Temp 98.9°F | Ht 72.0 in | Wt 222.7 lb

## 2022-03-31 DIAGNOSIS — I10 Essential (primary) hypertension: Secondary | ICD-10-CM | POA: Diagnosis not present

## 2022-03-31 DIAGNOSIS — I471 Supraventricular tachycardia, unspecified: Secondary | ICD-10-CM

## 2022-03-31 MED ORDER — OLMESARTAN MEDOXOMIL 20 MG PO TABS: ORAL_TABLET | ORAL | 3 refills | Status: DC

## 2022-03-31 MED ORDER — CARVEDILOL 6.25 MG PO TABS
ORAL_TABLET | ORAL | 3 refills | Status: DC
Start: 2022-03-31 — End: 2022-09-30

## 2022-03-31 MED ORDER — OMEPRAZOLE 40 MG PO CPDR: DELAYED_RELEASE_CAPSULE | ORAL | 3 refills | Status: AC

## 2022-03-31 NOTE — Patient Instructions (Signed)
Set up complete physical soon.   Continue to monitor blood pressure closely.

## 2022-03-31 NOTE — Progress Notes (Signed)
Established Patient Office Visit  Subjective   Patient ID: [name] [name], male    DOB: 08/04/1965  Age: 57 y.o. MRN: [id number]  No chief complaint on file.   HPI   [name] [name] is here for medical follow-up.  He has hypertension treated with olmesartan 20 mg daily.  He ran out of medication a few days ago.  He states home blood pressures have consistently been well-controlled with average 124/74 over the past several weeks.  Not currently exercising.  He is unfortunately still smoking.  Overdue for physical.  Past history of nonsustained paroxysmal supraventricular tachycardia.  He remains on low-dose carvedilol.  This was started by cardiology years ago.  No recent palpitations.  No recent chest pains.  He has history of GERD controlled with omeprazole 20 mg daily.  Past Medical History:  Diagnosis Date   Allergy    seasonal   Asthma    wheezing and cough   Daily headache    Diverticula of colon    Dysrhythmia    Enlarged prostate with lower urinary tract symptoms (LUTS)    GERD 12/23/2009   H/O wheezing    uses inhaler   History of rectal bleeding    Hypertension    IBS (irritable bowel syndrome)    Internal hemorrhoids    Nonsustained ventricular tachycardia ([place]) 06/2013   Pneumonia 10/2012   PVC (premature ventricular contraction)    Serrated adenoma of colon    Shortness of breath    "at rest" (11/22/2012)   Past Surgical History:  Procedure Laterality Date   COLONOSCOPY     COLONOSCOPY W/ BIOPSIES AND POLYPECTOMY  02/21/2017   DIAGNOSTIC LAPAROSCOPY     versus open ileocectomy Dr. Vashti Hey 04-20-17   LAPAROSCOPIC RIGHT HEMI COLECTOMY Right 04/20/2017   Procedure: LAPAROSCOPIC VS OPEN [place];  Surgeon: Ileana Roup, MD;  Location: [facility];  Service: General;  Laterality: Right;   POLYPECTOMY     VASECTOMY  1990's    reports that he has been smoking cigarettes. He has a 23.00 pack-year smoking history. He has never used smokeless tobacco. He  reports current alcohol use of about 28.0 standard drinks of alcohol per week. He reports that he does not use drugs. family history includes Colon polyps in his father; Diabetes in his maternal grandmother; Kidney failure in his father; Prostate cancer in his paternal grandfather; Stomach cancer in his paternal uncle; Stroke in his mother and paternal grandfather. No Known Allergies  Review of Systems  Constitutional:  Negative for malaise/fatigue.  Eyes:  Negative for blurred vision.  Respiratory:  Negative for shortness of breath.   Cardiovascular:  Negative for chest pain.  Neurological:  Negative for dizziness, weakness and headaches.      Objective:     BP (!) 150/86 (BP Location: Left Arm, Patient Position: Sitting, Cuff Size: Large)   Pulse 95   Temp 98.9 F (37.2 C) (Oral)   Ht 6' (1.829 m)   Wt 222 lb 11.2 oz (101 kg)   SpO2 96%   BMI 30.20 kg/m    Physical Exam Vitals reviewed.  Constitutional:      Appearance: He is well-developed.  HENT:     Right Ear: External ear normal.     Left Ear: External ear normal.  Eyes:     Pupils: Pupils are equal, round, and reactive to light.  Neck:     Thyroid: No thyromegaly.  Cardiovascular:     Rate and Rhythm: Normal rate and  regular rhythm.  Pulmonary:     Effort: Pulmonary effort is normal. No respiratory distress.     Breath sounds: Normal breath sounds. No wheezing or rales.  Musculoskeletal:     Cervical back: Neck supple.     Right lower leg: No edema.     Left lower leg: No edema.  Neurological:     Mental Status: He is alert and oriented to person, place, and time.      No results found for any visits on 03/31/22.    The ASCVD Risk score (Arnett DK, et al., 2019) failed to calculate for the following reasons:   Cannot find a previous HDL lab   Cannot find a previous total cholesterol lab    Assessment & Plan:   #1 hypertension.  Controlled by home readings.  Blood pressure up today but out of  medication for about 3 days.  He is strongly encouraged to start exercising regularly and try to lose some weight.  Refill Benicar for 1 year.  #2 history of PSVT.  No recent symptoms.  Refill carvedilol 6.25 mg twice daily  #3 health maintenance.  Overdue for physical.  Set up complete physical.  Discussed low-dose CT lung cancer screening among other items.  Also needs follow-up labs.   Carolann Littler, MD

## 2022-04-21 ENCOUNTER — Other Ambulatory Visit: Payer: Self-pay | Admitting: Family Medicine

## 2022-05-18 DIAGNOSIS — Z720 Tobacco use: Secondary | ICD-10-CM | POA: Diagnosis not present

## 2022-05-22 ENCOUNTER — Other Ambulatory Visit: Payer: Self-pay | Admitting: Family Medicine

## 2022-06-10 ENCOUNTER — Other Ambulatory Visit: Payer: Self-pay | Admitting: Family Medicine

## 2022-07-19 ENCOUNTER — Other Ambulatory Visit: Payer: Self-pay | Admitting: Family Medicine

## 2022-08-17 ENCOUNTER — Other Ambulatory Visit: Payer: Self-pay | Admitting: Family Medicine

## 2022-09-22 DIAGNOSIS — Z125 Encounter for screening for malignant neoplasm of prostate: Secondary | ICD-10-CM | POA: Diagnosis not present

## 2022-09-22 DIAGNOSIS — E291 Testicular hypofunction: Secondary | ICD-10-CM | POA: Diagnosis not present

## 2022-09-23 ENCOUNTER — Other Ambulatory Visit: Payer: Self-pay | Admitting: Family Medicine

## 2022-09-29 ENCOUNTER — Other Ambulatory Visit: Payer: Self-pay | Admitting: Family Medicine

## 2022-09-29 DIAGNOSIS — E291 Testicular hypofunction: Secondary | ICD-10-CM | POA: Diagnosis not present

## 2022-09-29 DIAGNOSIS — N5201 Erectile dysfunction due to arterial insufficiency: Secondary | ICD-10-CM | POA: Diagnosis not present

## 2022-12-07 ENCOUNTER — Other Ambulatory Visit: Payer: Self-pay | Admitting: Family Medicine

## 2023-01-12 ENCOUNTER — Other Ambulatory Visit: Payer: Self-pay | Admitting: Family Medicine

## 2023-02-11 ENCOUNTER — Other Ambulatory Visit: Payer: Self-pay | Admitting: Family Medicine

## 2023-03-08 ENCOUNTER — Encounter: Payer: Self-pay | Admitting: Family Medicine

## 2023-03-08 ENCOUNTER — Ambulatory Visit (INDEPENDENT_AMBULATORY_CARE_PROVIDER_SITE_OTHER): Payer: BC Managed Care – PPO | Admitting: Family Medicine

## 2023-03-08 VITALS — BP 148/90 | HR 83 | Temp 98.3°F | Ht 72.0 in | Wt 226.5 lb

## 2023-03-08 DIAGNOSIS — Z0000 Encounter for general adult medical examination without abnormal findings: Secondary | ICD-10-CM | POA: Diagnosis not present

## 2023-03-08 DIAGNOSIS — I10 Essential (primary) hypertension: Secondary | ICD-10-CM

## 2023-03-08 DIAGNOSIS — R931 Abnormal findings on diagnostic imaging of heart and coronary circulation: Secondary | ICD-10-CM

## 2023-03-08 DIAGNOSIS — Z720 Tobacco use: Secondary | ICD-10-CM

## 2023-03-08 LAB — LIPID PANEL
Cholesterol: 232 mg/dL — ABNORMAL HIGH (ref 0–200)
HDL: 63.8 mg/dL (ref >39.00)
LDL Cholesterol: 133 mg/dL — ABNORMAL HIGH (ref 0–99)
NonHDL: 168.21
Total CHOL/HDL Ratio: 4
Triglycerides: 177 mg/dL — ABNORMAL HIGH (ref 0.0–149.0)
VLDL: 35.4 mg/dL (ref 0.0–40.0)

## 2023-03-08 LAB — CBC WITH DIFFERENTIAL/PLATELET
Basophils Absolute: 0.1 10*3/uL (ref 0.0–0.1)
Basophils Relative: 1 % (ref 0.0–3.0)
Eosinophils Absolute: 0.2 10*3/uL (ref 0.0–0.7)
Eosinophils Relative: 2.4 % (ref 0.0–5.0)
HCT: 48.2 % (ref 39.0–52.0)
Hemoglobin: 16.3 g/dL (ref 13.0–17.0)
Lymphocytes Relative: 23.2 % (ref 12.0–46.0)
Lymphs Abs: 2 10*3/uL (ref 0.7–4.0)
MCHC: 33.8 g/dL (ref 30.0–36.0)
MCV: 94.8 fL (ref 78.0–100.0)
Monocytes Absolute: 0.9 10*3/uL (ref 0.1–1.0)
Monocytes Relative: 10.2 % (ref 3.0–12.0)
Neutro Abs: 5.3 10*3/uL (ref 1.4–7.7)
Neutrophils Relative %: 63.2 % (ref 43.0–77.0)
Platelets: 287 10*3/uL (ref 150.0–400.0)
RBC: 5.09 Mil/uL (ref 4.22–5.81)
RDW: 14.2 % (ref 11.5–15.5)
WBC: 8.5 10*3/uL (ref 4.0–10.5)

## 2023-03-08 LAB — COMPREHENSIVE METABOLIC PANEL
ALT: 26 U/L (ref 0–53)
AST: 23 U/L (ref 0–37)
Albumin: 4.6 g/dL (ref 3.5–5.2)
Alkaline Phosphatase: 22 U/L — ABNORMAL LOW (ref 39–117)
BUN: 17 mg/dL (ref 6–23)
CO2: 31 meq/L (ref 19–32)
Calcium: 9.9 mg/dL (ref 8.4–10.5)
Chloride: 100 meq/L (ref 96–112)
Creatinine, Ser: 1.03 mg/dL (ref 0.40–1.50)
GFR: 79.74 mL/min (ref >60.00)
Glucose, Bld: 100 mg/dL — ABNORMAL HIGH (ref 70–99)
Potassium: 4.1 meq/L (ref 3.5–5.1)
Sodium: 137 meq/L (ref 135–145)
Total Bilirubin: 0.7 mg/dL (ref 0.2–1.2)
Total Protein: 7.9 g/dL (ref 6.0–8.3)

## 2023-03-08 NOTE — Patient Instructions (Signed)
Monitor blood pressure and be in touch if consistently > 140/90.

## 2023-03-08 NOTE — Progress Notes (Signed)
 Established Patient Office Visit  Subjective   Patient ID: [name] [name], male    DOB: December 17, 1965  Age: 58 y.o. MRN: [id number]  Chief Complaint  Patient presents with   Annual Exam    HPI   [name] [name] is seen for physical exam.  He has history of hypertension, paroxysmal supraventricular tachycardia, asthma, GERD, ongoing tobacco use.  He uses Symbicort regularly.  He is on testosterone replacement per urology.  Reportedly had PSA there few months ago.  He takes olmesartan 20 mg daily for hypertension.  He states home blood pressures are generally 120s to 130s systolic.  He thinks he has some whitecoat syndrome.  Still smoking about a pack and half cigarettes per day.  Occasional early morning cough.  Health maintenance reviewed  Health Maintenance  Topic Date Due   HIV Screening  Never done   Hepatitis C Screening  Never done   Lung Cancer Screening  Never done   INFLUENZA VACCINE  08/26/2022   COVID-19 Vaccine (1) 03/24/2023 (Originally [birth year]01/1969)   Zoster Vaccines- Shingrix (1 of 2) 06/05/2023 (Originally 02/02/1983)   DTaP/Tdap/Td (1 - Tdap) 03/07/2024 (Originally 02/02/1983)   Colonoscopy  11/10/2025   Pneumococcal Vaccine 51-69 Years old  Completed   HPV VACCINES  Aged Out   -Does consent to Prevnar 20 today but declines flu vaccine. -We discussed low-dose CT lung cancer screening.  He meets criteria in terms of age, over 20-pack-year history of smoking, and current smoker -Discussed possible coronary calcium score for further restratification  Social History   Socioeconomic History   Marital status: Married    Spouse name: Not on file   Number of children: 3   Years of education: Not on file   Highest education level: Bachelor's degree (e.g., BA, AB, BS)  Occupational History   Occupation: Warehouse manager: [employer]   Occupation: OWNER    Employer: [employer]  Tobacco Use   Smoking status: Every Day    Current packs/day: 1.00     Average packs/day: 1 pack/day for 23.0 years (23.0 ttl pk-yrs)    Types: Cigarettes   Smokeless tobacco: Never  Vaping Use   Vaping status: Never Used  Substance and Sexual Activity   Alcohol use: Yes    Alcohol/week: 28.0 standard drinks of alcohol    Types: 28 Shots of liquor per week    Comment: 11/21/2012 "couple shots in each drink; 2 mixed drinks/night"   Drug use: No   Sexual activity: Yes  Other Topics Concern   Not on file  Social History Narrative   Patient does regular exercise.   Right Handed    Lives in a three story home    Social Drivers of Health   Financial Resource Strain: Low Risk  (03/07/2023)   Overall Financial Resource Strain (CARDIA)    Difficulty of Paying Living Expenses: Not hard at all  Food Insecurity: No Food Insecurity (03/07/2023)   Hunger Vital Sign    Worried About Running Out of Food in the Last Year: Never true    Ran Out of Food in the Last Year: Never true  Transportation Needs: No Transportation Needs (03/07/2023)   PRAPARE - Administrator, Civil Service (Medical): No    Lack of Transportation (Non-Medical): No  Physical Activity: Insufficiently Active (03/07/2023)   Exercise Vital Sign    Days of Exercise per Week: 1 day    Minutes of Exercise per Session: 10 min  Stress:  No Stress Concern Present (03/07/2023)   [employer] of Occupational Health - Occupational Stress Questionnaire    Feeling of Stress : Only a little  Social Connections: Moderately Integrated (03/07/2023)   Social Connection and Isolation Panel [NHANES]    Frequency of Communication with Friends and Family: Three times a week    Frequency of Social Gatherings with Friends and Family: Once a week    Attends Religious Services: 1 to 4 times per year    Active Member of Clubs or Organizations: No    Attends Engineer, structural: Not on file    Marital Status: Married  Catering manager Violence: Not on file   Family History  Problem Relation  Age of Onset   Stroke Mother    Colon polyps Father    Kidney failure Father    Stomach cancer Paternal Uncle    Diabetes Maternal Grandmother    Prostate cancer Paternal Grandfather    Stroke Paternal Grandfather    Colon cancer Neg Hx    Esophageal cancer Neg Hx    Rectal cancer Neg Hx    Pancreatic cancer Neg Hx    Past Medical History:  Diagnosis Date   Allergy    seasonal   Asthma    wheezing and cough   Daily headache    Diverticula of colon    Dysrhythmia    Enlarged prostate with lower urinary tract symptoms (LUTS)    GERD 12/23/2009   H/O wheezing    uses inhaler   History of rectal bleeding    Hypertension    IBS (irritable bowel syndrome)    Internal hemorrhoids    Nonsustained ventricular tachycardia (HCC) 06/2013   Pneumonia 10/2012   PVC (premature ventricular contraction)    Serrated adenoma of colon    Shortness of breath    "at rest" (11/22/2012)   Past Surgical History:  Procedure Laterality Date   COLON SURGERY  01/2017   COLONOSCOPY     COLONOSCOPY W/ BIOPSIES AND POLYPECTOMY  02/21/2017   DIAGNOSTIC LAPAROSCOPY     versus open ileocectomy Dr. Kallie Edward 04-20-17   LAPAROSCOPIC RIGHT HEMI COLECTOMY Right 04/20/2017   Procedure: LAPAROSCOPIC VS OPEN ILEOCECTOMY ERAS PATHWAY;  Surgeon: Andria Meuse, MD;  Location: [facility];  Service: General;  Laterality: Right;   POLYPECTOMY     VASECTOMY  1990's    reports that he has been smoking cigarettes. He has a 23 pack-year smoking history. He has never used smokeless tobacco. He reports current alcohol use of about 28.0 standard drinks of alcohol per week. He reports that he does not use drugs. family history includes Colon polyps in his father; Diabetes in his maternal grandmother; Kidney failure in his father; Prostate cancer in his paternal grandfather; Stomach cancer in his paternal uncle; Stroke in his mother and paternal grandfather. No Known Allergies  The ASCVD Risk score (Arnett DK, et al.,  2019) failed to calculate for the following reasons:   Cannot find a previous HDL lab   Cannot find a previous total cholesterol lab  Review of Systems  Constitutional:  Negative for chills, fever, malaise/fatigue and weight loss.  HENT:  Negative for hearing loss.   Eyes:  Negative for blurred vision and double vision.  Respiratory:  Negative for shortness of breath.   Cardiovascular:  Negative for chest pain, palpitations and leg swelling.  Gastrointestinal:  Negative for abdominal pain, blood in stool, constipation and diarrhea.  Genitourinary:  Negative for dysuria.  Skin:  Negative  for rash.  Neurological:  Negative for dizziness, speech change, seizures, loss of consciousness and headaches.  Psychiatric/Behavioral:  Negative for depression.       Objective:     BP (!) 148/90 (BP Location: Right Arm, Cuff Size: Normal)   Pulse 83   Temp 98.3 F (36.8 C) (Oral)   Ht 6' (1.829 m)   Wt 226 lb 8 oz (102.7 kg)   SpO2 97%   BMI 30.72 kg/m  BP Readings from Last 3 Encounters:  03/08/23 (!) 148/90  03/31/22 (!) 150/86  02/17/21 (!) 146/94   Wt Readings from Last 3 Encounters:  03/08/23 226 lb 8 oz (102.7 kg)  03/31/22 222 lb 11.2 oz (101 kg)  02/17/21 210 lb 9.6 oz (95.5 kg)      Physical Exam Vitals reviewed.  Constitutional:      General: He is not in acute distress.    Appearance: He is well-developed. He is not ill-appearing.  HENT:     Head: Normocephalic and atraumatic.     Right Ear: External ear normal.     Left Ear: External ear normal.  Eyes:     Conjunctiva/sclera: Conjunctivae normal.     Pupils: Pupils are equal, round, and reactive to light.  Neck:     Thyroid: No thyromegaly.  Cardiovascular:     Rate and Rhythm: Normal rate and regular rhythm.     Heart sounds: Normal heart sounds. No murmur heard. Pulmonary:     Effort: No respiratory distress.     Breath sounds: No rales.     Comments: Does have a few faint scattered expiratory  wheezes. Abdominal:     General: Bowel sounds are normal. There is no distension.     Palpations: Abdomen is soft. There is no mass.     Tenderness: There is no abdominal tenderness. There is no guarding or rebound.  Musculoskeletal:     Cervical back: Normal range of motion and neck supple.     Right lower leg: No edema.     Left lower leg: No edema.  Lymphadenopathy:     Cervical: No cervical adenopathy.  Skin:    Findings: No rash.  Neurological:     Mental Status: He is alert and oriented to person, place, and time.     Cranial Nerves: No cranial nerve deficit.     Deep Tendon Reflexes: Reflexes normal.      No results found for any visits on 03/08/23.    The ASCVD Risk score (Arnett DK, et al., 2019) failed to calculate for the following reasons:   Cannot find a previous HDL lab   Cannot find a previous total cholesterol lab    Assessment & Plan:   Problem List Items Addressed This Visit       Unprioritized   Essential hypertension   Other Visit Diagnoses       Well adult exam    -  Primary   Relevant Orders   CBC with Differential/Platelet   Comprehensive metabolic panel   Hemoglobin A1c   Lipid panel   Pneumococcal conjugate vaccine 20-valent (Completed)     58 year old male with ongoing nicotine use.  Past history of PSVT.  Treated with carvedilol.  He has asthma treated with Symbicort.  Elevated blood pressure today and he has history of hypertension treated with Benicar.  Home blood pressures are well-controlled.  Recommended several items as follows  -Consider flu vaccine.  He declines today but does agree to [employer] 20 -Strongly  encouraged to stop smoking -Set up low-dose CT lung cancer screening.  Patient agrees -Set up coronary calcium score to further risk stratify -Try to establish more consistent exercise -Obtain follow-up labs as above -Did not obtain PSA as he is getting these yearly through urology.  No follow-ups on file.    Evelena Peat, MD

## 2023-03-09 LAB — HEMOGLOBIN A1C: Hgb A1c MFr Bld: 5.6 % (ref 4.6–6.5)

## 2023-03-15 ENCOUNTER — Telehealth: Payer: BC Managed Care – PPO | Admitting: Physician Assistant

## 2023-03-15 DIAGNOSIS — R6889 Other general symptoms and signs: Secondary | ICD-10-CM | POA: Diagnosis not present

## 2023-03-15 MED ORDER — OSELTAMIVIR PHOSPHATE 75 MG PO CAPS: 75.0000 mg | ORAL_CAPSULE | Freq: Two times a day (BID) | ORAL | 0 refills | Status: AC

## 2023-03-15 MED ORDER — BENZONATATE 100 MG PO CAPS: 100.0000 mg | ORAL_CAPSULE | Freq: Three times a day (TID) | ORAL | 0 refills | Status: AC | PRN

## 2023-03-15 NOTE — Progress Notes (Signed)
 Virtual Visit Consent   [name] [name], you are scheduled for a virtual visit with a [facility] provider today. Just as with appointments in the office, your consent must be obtained to participate. Your consent will be active for this visit and any virtual visit you may have with one of our providers in the next 365 days. If you have a MyChart account, a copy of this consent can be sent to you electronically.  As this is a virtual visit, video technology does not allow for your provider to perform a traditional examination. This may limit your provider's ability to fully assess your condition. If your provider identifies any concerns that need to be evaluated in person or the need to arrange testing (such as labs, EKG, etc.), we will make arrangements to do so. Although advances in technology are sophisticated, we cannot ensure that it will always work on either your end or our end. If the connection with a video visit is poor, the visit may have to be switched to a telephone visit. With either a video or telephone visit, we are not always able to ensure that we have a secure connection.  By engaging in this virtual visit, you consent to the provision of healthcare and authorize for your insurance to be billed (if applicable) for the services provided during this visit. Depending on your insurance coverage, you may receive a charge related to this service.  I need to obtain your verbal consent now. Are you willing to proceed with your visit today? [name] [name] has provided verbal consent on 03/15/2023 for a virtual visit (video or telephone). Piedad Climes, [place]  Date: 03/15/2023 9:30 AM   Virtual Visit via Video Note   I, Piedad Climes, connected with  [name] [name]  ([id number], 07-27-1965) on 03/15/23 at  9:30 AM EST by a video-enabled telemedicine application and verified that I am speaking with the correct person using two identifiers.  Location: Patient: Virtual Visit Location  Patient: Home Provider: Virtual Visit Location Provider: Home Office   I discussed the limitations of evaluation and management by telemedicine and the availability of in person appointments. The patient expressed understanding and agreed to proceed.    History of Present Illness: [name] [name] is a 58 y.o. who identifies as a male who was assigned male at birth, and is being seen today for flu-like symptoms starting Sunday evening.  Notes started with scratchy throat and body aches, followed by chills, coughing and chest congestion. Symptoms have progressed since onset with pounding headache, hoarseness, persistent dry cough. Notes is unable to sleep at night because of the cough. No fever at present but taking Advil. Wife with flu starting Thursday of last week. Took home COVID test as a precaution.   T 99.5, O2 94% RA, HR 110 bpm.  BP 170/101. Before took medication.  OTC -- Advil, Mucinex, Alka Seltzer Cold and Flu.   HPI: HPI  Problems:  Patient Active Problem List   Diagnosis Date Noted   Essential hypertension 01/27/2021   Moderate persistent asthma 03/28/2019   Colon polyp 04/20/2017   H/O wheezing 03/15/2017   Nonsustained paroxysmal supraventricular tachycardia (HCC) 07/13/2013   Chest pain 11/22/2012   Tobacco abuse 11/22/2012   GERD 12/23/2009    Allergies: No Known Allergies Medications:  Current Outpatient Medications:    benzonatate (TESSALON) 100 MG capsule, Take 1 capsule (100 mg total) by mouth 3 (three) times daily as needed for cough., Disp: 30 capsule, Rfl:  0   oseltamivir (TAMIFLU) 75 MG capsule, Take 1 capsule (75 mg total) by mouth 2 (two) times daily for 5 days., Disp: 10 capsule, Rfl: 0   albuterol (PROVENTIL HFA) 108 (90 Base) MCG/ACT inhaler, Inhale 2 puffs into the lungs every 4 (four) hours as needed for shortness of breath., Disp: 18 g, Rfl: 2   budesonide-formoterol (SYMBICORT) 160-4.5 MCG/ACT inhaler, INHALE 2 PUFFS INTO THE LUNGS TWICE DAILY, Disp:  10.2 g, Rfl: 0   carvedilol (COREG) 6.25 MG tablet, TAKE 1 TABLET BY MOUTH TWICE DAILY, Disp: 90 tablet, Rfl: 0   ibuprofen (ADVIL,MOTRIN) 200 MG tablet, Take 400-600 mg by mouth daily as needed for headache or moderate pain., Disp: , Rfl:    olmesartan (BENICAR) 20 MG tablet, TAKE 1 TABLET(20 MG) BY MOUTH DAILY, Disp: 90 tablet, Rfl: 3   omeprazole (PRILOSEC) 40 MG capsule, TAKE 1 CAPSULE BY MOUTH EVERY DAY, Disp: 90 capsule, Rfl: 3   oxymetazoline (AFRIN) 0.05 % nasal spray, Place 1 spray into both nostrils daily as needed for congestion., Disp: , Rfl:    Testosterone 30 MG/ACT SOLN, Place 1 application onto the skin daily., Disp: , Rfl:    VIAGRA 100 MG tablet, Take 100 mg by mouth as needed for erectile dysfunction. , Disp: , Rfl:   Observations/Objective: Patient is well-developed, well-nourished in no acute distress.  Resting comfortably at home.  Head is normocephalic, atraumatic.  No labored breathing. Speech is clear and coherent with logical content.  Patient is alert and oriented at baseline.   Assessment and Plan: 1. Flu-like symptoms (Primary) - oseltamivir (TAMIFLU) 75 MG capsule; Take 1 capsule (75 mg total) by mouth 2 (two) times daily for 5 days.  Dispense: 10 capsule; Refill: 0 - benzonatate (TESSALON) 100 MG capsule; Take 1 capsule (100 mg total) by mouth 3 (three) times daily as needed for cough.  Dispense: 30 capsule; Refill: 0  Negative COVID. Classic influenza symptoms. Known exposure. Supportive measures, OTC medications and Vitamin recommendations reviewed. Will start Tamiflu per orders. Tessalon per orders. Quarantine reviewed with patient.    Follow Up Instructions: I discussed the assessment and treatment plan with the patient. The patient was provided an opportunity to ask questions and all were answered. The patient agreed with the plan and demonstrated an understanding of the instructions.  A copy of instructions were sent to the patient via MyChart unless  otherwise noted below.   The patient was advised to call back or seek an in-person evaluation if the symptoms worsen or if the condition fails to improve as anticipated.    Piedad Climes, PA-C

## 2023-03-15 NOTE — Patient Instructions (Signed)
 [name] [name], thank you for joining Piedad Climes, PA-C for today's virtual visit.  While this provider is not your primary care provider (PCP), if your PCP is located in our provider database this encounter information will be shared with them immediately following your visit.   A [facility] MyChart account gives you access to today's visit and all your visits, tests, and labs performed at [facility] " click here if you don't have a [facility] MyChart account or go to [email or web address]  Consent: (Patient) [name] [name] provided verbal consent for this virtual visit at the beginning of the encounter.  Current Medications:  Current Outpatient Medications:    albuterol (PROVENTIL HFA) 108 (90 Base) MCG/ACT inhaler, Inhale 2 puffs into the lungs every 4 (four) hours as needed for shortness of breath., Disp: 18 g, Rfl: 2   budesonide-formoterol (SYMBICORT) 160-4.5 MCG/ACT inhaler, INHALE 2 PUFFS INTO THE LUNGS TWICE DAILY, Disp: 10.2 g, Rfl: 0   carvedilol (COREG) 6.25 MG tablet, TAKE 1 TABLET BY MOUTH TWICE DAILY, Disp: 90 tablet, Rfl: 0   ibuprofen (ADVIL,MOTRIN) 200 MG tablet, Take 400-600 mg by mouth daily as needed for headache or moderate pain., Disp: , Rfl:    olmesartan (BENICAR) 20 MG tablet, TAKE 1 TABLET(20 MG) BY MOUTH DAILY, Disp: 90 tablet, Rfl: 3   omeprazole (PRILOSEC) 40 MG capsule, TAKE 1 CAPSULE BY MOUTH EVERY DAY, Disp: 90 capsule, Rfl: 3   oxymetazoline (AFRIN) 0.05 % nasal spray, Place 1 spray into both nostrils daily as needed for congestion., Disp: , Rfl:    Testosterone 30 MG/ACT SOLN, Place 1 application onto the skin daily., Disp: , Rfl:    VIAGRA 100 MG tablet, Take 100 mg by mouth as needed for erectile dysfunction. , Disp: , Rfl:    Medications ordered in this encounter:  No orders of the defined types were placed in this encounter.    *If you need refills on other medications prior to your next appointment, please contact your  pharmacy*  Follow-Up: Call back or seek an in-person evaluation if the symptoms worsen or if the condition fails to improve as anticipated.  [facility] [phone]  Other Instructions Please keep well-hydrated and try to get plenty of rest. If you have a humidifier, place it in the bedroom and run it at night. Start a saline nasal rinse for nasal congestion. You can consider use of a nasal steroid spray like Flonase or Nasacort OTC. You can alternate between Tylenol and Ibuprofen if needed for fever, body aches, headache and/or throat pain. Salt water-gargles and chloraseptic spray can be very beneficial for sore throat. Coricidin HBP for cold symptoms. Please take all prescribed medications as directed.  Remain out of work until [employer] for 24 hours without a fever-reducing medication, and you are feeling better.  You should mask until symptoms are resolved.  If anything worsens despite treatment, you need to be evaluated in-person. Please do not delay care.  Influenza, Adult Influenza is also called "the flu." It is an infection in the lungs, nose, and throat (respiratory tract). It spreads easily from person to person (is contagious). The flu causes symptoms that are like a cold, along with high fever and body aches. What are the causes? This condition is caused by the influenza virus. You can get the virus by: Breathing in droplets that are in the air after a person infected with the flu coughed or sneezed. Touching something that has the virus on it  and then touching your mouth, nose, or eyes. What increases the risk? Certain things may make you more likely to get the flu. These include: Not washing your hands often. Having close contact with many people during cold and flu season. Touching your mouth, eyes, or nose without first washing your hands. Not getting a flu shot every year. You may have a higher risk for the flu, and serious problems, such as a lung  infection (pneumonia), if you: Are older than 65. Are pregnant. Have a weakened disease-fighting system (immune system) because of a disease or because you are taking certain medicines. Have a long-term (chronic) condition, such as: Heart, kidney, or lung disease. Diabetes. Asthma. Have a liver disorder. Are very overweight (morbidly obese). Have anemia. What are the signs or symptoms? Symptoms usually begin suddenly and last 4-14 days. They may include: Fever and chills. Headaches, body aches, or muscle aches. Sore throat. Cough. Runny or stuffy (congested) nose. Feeling discomfort in your chest. Not wanting to eat as much as normal. Feeling weak or tired. Feeling dizzy. Feeling sick to your stomach or throwing up. How is this treated? If the flu is found early, you can be treated with antiviral medicine. This can help to reduce how bad the illness is and how long it lasts. This may be given by mouth or through an IV tube. Taking care of yourself at home can help your symptoms get better. Your doctor may want you to: Take over-the-counter medicines. Drink plenty of fluids. The flu often goes away on its own. If you have very bad symptoms or other problems, you may be treated in a hospital. Follow these instructions at home:     Activity Rest as needed. Get plenty of sleep. Stay home from work or school as told by your doctor. Do not leave home until you do not have a fever for 24 hours without taking medicine. Leave home only to go to your doctor. Eating and drinking Take an ORS (oral rehydration solution). This is a drink that is sold at pharmacies and stores. Drink enough fluid to keep your pee pale yellow. Drink clear fluids in small amounts as you are able. Clear fluids include: Water. Ice chips. Fruit juice mixed with water. Low-calorie sports drinks. Eat bland foods that are easy to digest. Eat small amounts as you are able. These foods  include: Bananas. Applesauce. Rice. Lean meats. Toast. Crackers. Do not eat or drink: Fluids that have a lot of sugar or caffeine. Alcohol. Spicy or fatty foods. General instructions Take over-the-counter and prescription medicines only as told by your doctor. Use a cool mist humidifier to add moisture to the air in your home. This can make it easier for you to breathe. When using a cool mist humidifier, clean it daily. Empty water and replace with clean water. Cover your mouth and nose when you cough or sneeze. Wash your hands with soap and water often and for at least 20 seconds. This is also important after you cough or sneeze. If you cannot use soap and water, use alcohol-based hand sanitizer. Keep all follow-up visits. How is this prevented?  Get a flu shot every year. You may get the flu shot in late summer, fall, or winter. Ask your doctor when you should get your flu shot. Avoid contact with people who are sick during fall and winter. This is cold and flu season. Contact a doctor if: You get new symptoms. You have: Chest pain. Watery poop (diarrhea). A fever.  Your cough gets worse. You start to have more mucus. You feel sick to your stomach. You throw up. Get help right away if you: Have shortness of breath. Have trouble breathing. Have skin or nails that turn a bluish color. Have very bad pain or stiffness in your neck. Get a sudden headache. Get sudden pain in your face or ear. Cannot eat or drink without throwing up. These symptoms may represent a serious problem that is an emergency. Get medical help right away. Call your local emergency services (911 in the [place] Do not wait to see if the symptoms will go away. Do not drive yourself to the hospital. Summary Influenza is also called "the flu." It is an infection in the lungs, nose, and throat. It spreads easily from person to person. Take over-the-counter and prescription medicines only as told by your  doctor. Getting a flu shot every year is the best way to not get the flu. This information is not intended to replace advice given to you by your health care provider. Make sure you discuss any questions you have with your health care provider. Document Revised: 08/31/2019 Document Reviewed: 08/31/2019 Elsevier Patient Education  2023 Elsevier Inc.      If you have been instructed to have an in-person evaluation today at a local Urgent Care facility, please use the link below. It will take you to a list of all of our available [facility], including address, phone number and hours of operation. Please do not delay care.  [facility]  If you or a family member do not have a primary care provider, use the link below to schedule a visit and establish care. When you choose a [facility] primary care physician or advanced practice provider, you gain a long-term partner in health. Find a Primary Care Provider  Learn more about [facility]'s in-office and virtual care options: [place] [facility] - Get Care Now

## 2023-03-17 ENCOUNTER — Other Ambulatory Visit (HOSPITAL_COMMUNITY): Payer: BC Managed Care – PPO

## 2023-03-21 ENCOUNTER — Other Ambulatory Visit: Payer: Self-pay | Admitting: Family Medicine

## 2023-03-29 ENCOUNTER — Other Ambulatory Visit: Payer: Self-pay | Admitting: Family Medicine

## 2023-04-07 ENCOUNTER — Ambulatory Visit (HOSPITAL_COMMUNITY)
Admission: RE | Admit: 2023-04-07 | Discharge: 2023-04-07 | Disposition: A | Discharge status: Home / Self Care | Payer: Self-pay | Source: Ambulatory Visit | Attending: Family Medicine | Admitting: Family Medicine

## 2023-04-07 DIAGNOSIS — Z720 Tobacco use: Secondary | ICD-10-CM

## 2023-04-07 DIAGNOSIS — I10 Essential (primary) hypertension: Secondary | ICD-10-CM

## 2023-04-11 ENCOUNTER — Telehealth: Payer: Self-pay

## 2023-04-11 MED ORDER — ATORVASTATIN CALCIUM 20 MG PO TABS: 20.0000 mg | ORAL_TABLET | Freq: Every day | ORAL | 0 refills | Status: DC

## 2023-04-11 NOTE — Addendum Note (Signed)
 Addended by: Christy Sartorius on: 04/11/2023 03:05 PM   Modules accepted: Orders

## 2023-04-11 NOTE — Telephone Encounter (Signed)
 Copied from CRM [id number]. Topic: Clinical - Prescription Issue >> Apr 11, 2023  1:19 PM Eunice Blase wrote: Reason for CRM: Pt returning call for CT CARDIAC SCORING which was provided. Pt agreed to Lipitor 20 mg once daily and is scheduled for lipid and hepatic panel in 8 weeks.

## 2023-04-11 NOTE — Telephone Encounter (Signed)
 Please see result note

## 2023-04-18 DIAGNOSIS — E291 Testicular hypofunction: Secondary | ICD-10-CM | POA: Diagnosis not present

## 2023-04-18 DIAGNOSIS — Z125 Encounter for screening for malignant neoplasm of prostate: Secondary | ICD-10-CM | POA: Diagnosis not present

## 2023-04-21 DIAGNOSIS — N5201 Erectile dysfunction due to arterial insufficiency: Secondary | ICD-10-CM | POA: Diagnosis not present

## 2023-04-21 DIAGNOSIS — E291 Testicular hypofunction: Secondary | ICD-10-CM | POA: Diagnosis not present

## 2023-04-26 ENCOUNTER — Telehealth: Payer: Self-pay

## 2023-04-26 ENCOUNTER — Encounter: Payer: Self-pay | Admitting: Family Medicine

## 2023-04-26 DIAGNOSIS — R918 Other nonspecific abnormal finding of lung field: Secondary | ICD-10-CM

## 2023-04-26 MED ORDER — AMLODIPINE BESYLATE 5 MG PO TABS: 5.0000 mg | ORAL_TABLET | Freq: Every day | ORAL | 5 refills | Status: DC

## 2023-04-26 NOTE — Telephone Encounter (Signed)
 Copied from CRM [id number]. Topic: Clinical - Lab/Test Results >> Apr 26, 2023  9:04 AM Sim Boast F wrote: Reason for CRM: Radiology called to make sure Dr. Caryl Never reviews ADDENDUM REPORT: 04/25/2023 for patients OVER-READ INTERPRETATION CHEST CT

## 2023-04-26 NOTE — Progress Notes (Signed)
 Spoke with patient.  Recent CT coronary calcium scoring lung over read revealed 6 mm anterior left lower lobe pulmonary nodule.  Recommended follow-up in 6 to 12 months.  Will set up repeat CT noncontrast in 6 months.  Patient also relates fairly consistent blood pressures over 140 systolic in spite of Benicar and carvedilol.  He has had this for several weeks.  Will add amlodipine 5 mg daily and give feedback regarding blood pressures in the next 3 to 4 weeks  Kristian Covey MD [facility] at [facility]

## 2023-05-11 ENCOUNTER — Ambulatory Visit: Payer: BC Managed Care – PPO | Admitting: Psychology

## 2023-05-11 DIAGNOSIS — F89 Unspecified disorder of psychological development: Secondary | ICD-10-CM

## 2023-05-11 NOTE — Progress Notes (Addendum)
 Date: 05/11/2023 Appointment Start Time: 9am Duration: 105 minutes Provider: Maryetta Sneddon, PsyD Type of Session: Initial Appointment for Evaluation  Location of Patient: Home Location of Provider: Provider's Home (private office) Type of Contact: Caregility video visit with audio  Session Content:  Prior to proceeding with today's appointment, two pieces of identifying information were obtained from [name] [name] to verify identity. In addition, [name] [name]'s physical location at the time of this appointment was obtained. In the event of technical difficulties, [name] [name] shared a phone number he could be reached at. [name] [name] and this provider participated in today's telepsychological service. [name] [name] denied anyone else being present in the room or on the virtual appointment.  The provider's role was explained to [name] [name]. The provider reviewed and discussed issues of confidentiality, privacy, and limits therein (e.g., reporting obligations). In addition to verbal informed consent, written informed consent for psychological services was obtained from [name] [name] prior to the initial appointment. Written consent included information concerning the practice, financial arrangements, and confidentiality and patients' rights. Since the clinic is not a 24/7 crisis [name], mental health emergency resources were shared, and the provider explained e-mail, voicemail, and/or other messaging systems should be utilized only for non-emergency reasons. This provider also explained that information obtained during appointments will be placed in their electronic medical record in a confidential manner. [name] [name] verbally acknowledged understanding of the aforementioned and agreed to use mental health emergency resources discussed if needed. Moreover, [name] [name] agreed information may be shared with other [name] [name] or their referring provider(s) as needed for coordination of care. By signing the new patient documents, [name] provided written consent for  coordination of care. [name] [name] verbally acknowledged understanding he is ultimately responsible for understanding his insurance benefits as it relates to reimbursement of telepsychological and in-person services. This provider also reviewed confidentiality, as it relates to telepsychological services, as well as the rationale for telepsychological services. This provider further explained that video should not be captured, photos should not be taken, nor should testing stimuli be copied or recorded as it would be a copyright violation. [name] [name] expressed understanding of the aforementioned, and verbally consented to proceed. [name] [name] is aware of the limitations of teleheath visits and verbally consented to proceed.  [name] [name] completed the psychiatric diagnostic evaluation (history, including past, family, social, and  psychiatric history; behavioral observations; and establishment of a provisional diagnosis). The evaluation was completed in 105 minutes. Code 40981 was billed.   Mental Status Examination:  Appearance:  neat Behavior: appropriate to circumstances Mood: neutral Affect: mood congruent Speech: tangential  Eye Contact: appropriate Psychomotor Activity: restless Thought Process: denies suicidal, homicidal, and self-harm ideation, plan and intent Content/Perceptual Disturbances: none Orientation: AAOx4 Cognition/Sensorium: intact Insight: fair Judgment: good  Provisional DSM-5 diagnosis(es):  F89 Unspecified Disorder of Psychological Development   Plan: Testing is expected to answer the question, does the individual meet criteria for ADHD when age, other mental health concerns (e.g., depression, anxiety, and alcohol use), and cognitive functioning are taken into consideration? Further testing is warranted because a diagnosis cannot be given solely based on current interview data (further data is required). Testing results are expected to answer the remaining diagnostic questions in order to  provide an accurate diagnosis and assist in treatment planning with an expectation of improved clinical outcome. [name] [name] is currently scheduled for an appointment on 05/19/2023 at 10am via Caregility video visit with audio.         CONFIDENTIAL PSYCHOLOGICAL EVALUATION ______________________________________________________________________________ Name: [name] [name]   Date of Birth: Apr 02, 1965    Age: 58  SOURCE AND REASON FOR REFERRAL: [name] was self-referred for an evaluation to ascertain if he meets the criteria for Attention Deficit/Hyperactivity Disorder (ADHD).    BACKGROUND INFORMATION AND PRESENTING PROBLEM: [name] is a 58 year old male who resides in [place].  [name] reported he had a recent "annual physical" and that "one section pertained to [him]," noting he has a history of concentration issues, a tendency to "get bored," "constantly [be] up and down," and experiencing task initiation problems. He further reported he has experienced a significantly reduced lack of drive since his "77G." He endorsed the following ADHD-related symptoms:  Symptoms Frequency  Often Occasionally Infrequent/No Significant Issues Inattention Criterion (DSM-5-TR)    Making careless mistakes and missing small details.    He denied experiencing significant issues with this symptom.  Being easily distracted by various stimuli, the mind often wanders elsewhere even when no apparent distractions exist.  He described himself as easily distracted but noted that it was "hard to pinpoint" what stimuli caused it, although he noted that thoughts about other tasks are a common distractor.    Trouble sustaining attention during conversations.   He denied experiencing significant issues with this symptom. However, he noted that his wife comments that this symptom occurs with her, which he attributes to his perception that her communication is sometimes unclear.   Does not follow through on  instructions and fails to finish tasks.  He described being prone to becoming "bored" with a task and switching to something else, which can contribute to an extended time being required to complete the initially planned task.    Difficulty organizing tasks and activities.  He endorsed haphazard task completion, a tendency to wait until near a deadline to start tasks, and the common presence of clutter around him.    Avoids, dislikes, or is reluctant to engage in tasks that require sustained mental effort. He discussed a tendency to put off task he "do[es not] want to do," and this is especially likely if the task requires sustained attention (e.g., completing continuing educations and studying for tests he is required to take for his employment).   Loses things necessary for tasks or activities.  He reported commonly forgetting needed items for tasks, stating that if an item is "out of sight," he is prone to forgetting it.   Forgetful in daily activities.   He reported being prone to forgetting to return a phone call with someone, and that inattention and task initiation issues contribute to this. During the interview, he occasionally forgot what he wanted to say.   Hyperactivity and Impulsivity Criterion (DSM-5-TR)    Fidgets with hands or feet or squirms in seat. He stated this has occurred "all [of his] life" and that others have commented on it (e.g., his mother called him "fidget fingers" as a child).    Leaves seat in situations in which remaining seated is expected.   He denied experiencing significant issues with this symptom. Feelings of restlessness. He shared that he often feels restless and that he has to be moving or doing something, especially when he is required to stay seated for extended periods.    Difficulty playing or engaging in leisure activities quietly.   He denied experiencing significant issues with this symptom. "On the go" or often acts as if "driven by a motor."  He described  himself as always feeling he has to be moving or doing something. In the past, others indicated he could be hard to  keep up with, but since his 65s, he has experienced a significantly reduced drive to act on these thoughts and feelings.   Talks excessively.  He reported that he is prone to sharing "multiple" stories while talking.   Interrupts others.  He stated he tends to "talk over" others despite efforts not to.    Impatience.    He denied experiencing significant issues with this symptom. ADHD-Related Symptoms that Assist in Identifying ADHD but are not in the DSM-5-TR [name] [name], 2021, p. 6-12 and [phone])    Emotional dysregulation and overstimulation. He discussed that he tends to become overly excited and experience significant upset if a stressor occurs, adding that his "highs" seem to be "higher" and his low mood lower than others.    Making decisions impulsively. He described himself as "pretty spontaneous" and prone to making purchases he later regrets or that were a mistake and require him to return, as well as starting projects or tasks without fully understanding the directions.    Tending to drive much faster than others.   He denied experiencing significant issues with this symptom. Trouble following through on promises or commitments made to others.   He reported that this occasionally occurs and is generally attributable to "something coming up" or reduced motivation to follow through on the plan once the time comes.   Trouble doing things in proper order or sequence.   He denied experiencing significant issues with this symptom.    He also discussed a history of occasional sleep maintenance issues, regularly tossing and turning in his sleep, and snoring loudly, adding that more recently, he has often not felt rested upon waking.  He expressed a belief that his ADHD-related symptoms are consistent and independent of mood, noting they occur "every day" and that many have happened since  childhood but that some (e.g., task initiation issues) have become exacerbated in recent years. He stated his coping and compensatory strategies include using designated spaces for items, remembering number of tiems he needs, using "system" to remember to pay bills, use of a list, and making a "game in my head" that involves tracking how many times he did not act on an urge to interrupt.   [name] [name] denied awareness of having ever experienced any developmental milestone delays, grade retention, learning disability diagnosis, or having an individualized education plan. He reported he received "good" grades but "wasn't an Chief Executive Officer" through primary and secondary schooling. 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 He denied having a history of employment disciplinary actions, although he noted how he is prone to waiting until the last minute to complete tasks he does not enjoy (e.g., continuing education and taking a required annual test).    [name] [name] reported his medical history is significant for hypertension, hypercholesterolemia, and "irregular heartbeat" that are all being managed medically. He denied awareness of ever experiencing seizures or head injury. He reported use of two to three standard-sized alcoholic "cocktails" a day; a "pack and a half" of cigarettes a day, noting he has a desire  to cease use of tobacco; and five to six standard-sized cups of coffee a day. He denied use of all other recreational or illicit substances. He also denied  ever experiencing psychiatric hospitalization or having utilized mental health services as well as ever meeting the full criteria for depressive episode; hypomanic or manic episode; social anxiety; autism spectrum disorder; obsessions and compulsions; eating disorder; psychosis; suicidal or homicidal ideation, plan, or intent; or legal involvement. He stated his familial mental health history is significant for "ADHD" and "OCD" (son).             Veronica Gordon, PsyD

## 2023-05-12 ENCOUNTER — Other Ambulatory Visit: Payer: Self-pay | Admitting: Family Medicine

## 2023-05-19 ENCOUNTER — Ambulatory Visit: Admitting: Psychology

## 2023-05-19 DIAGNOSIS — F89 Unspecified disorder of psychological development: Secondary | ICD-10-CM

## 2023-05-19 NOTE — Progress Notes (Signed)
 Date: 05/19/2023   Appointment Start Time: 10am Duration: 50 minutes Provider: Maryetta Sneddon, PsyD Type of Session: Testing Appointment for Evaluation  Location of Patient: Home Location of Provider: Provider's Home (private office) Type of Contact: Caregility video visit with audio  Session Content: Today's appointment was a telepsychological visit. [name] [name] is aware it is his responsibility to secure confidentiality on his end of the session. Prior to proceeding with today's appointment, [name] [name]'s physical location at the time of this appointment was obtained as well a phone number he could be reached at in the event of technical difficulties. [name] [name] denied anyone else being present in the room or on the virtual appointment. This provider reviewed that video should not be captured, photos should not be taken, nor should testing stimuli be copied or recorded as it would be a copyright violation. [name] [name] expressed understanding of the aforementioned, and verbally consented to proceed. The WAIS-5 was administered, scored, and interpreted by this evaluator. [name] [name] is aware of the limitations of teleheath visits and verbally consented to proceed.  Billing codes will be input on the feedback appointment. There are no billing codes for the testing appointment.   Provisional DSM-5 diagnosis(es):  F89 Unspecified Disorder of Psychological Development   Plan: [name] [name] was scheduled for a feedback appointment on 05/26/2023 at 9am via Caregility video visit with audio.                Veronica Gordon, PsyD

## 2023-05-25 ENCOUNTER — Other Ambulatory Visit: Payer: Self-pay

## 2023-05-25 MED ORDER — BUDESONIDE-FORMOTEROL FUMARATE 160-4.5 MCG/ACT IN AERO: 2.0000 | INHALATION_SPRAY | Freq: Two times a day (BID) | RESPIRATORY_TRACT | 3 refills | Status: DC

## 2023-05-26 ENCOUNTER — Ambulatory Visit: Admitting: Psychology

## 2023-05-26 DIAGNOSIS — F900 Attention-deficit hyperactivity disorder, predominantly inattentive type: Secondary | ICD-10-CM

## 2023-05-26 NOTE — Progress Notes (Signed)
 Testing and Report Writing Information: The following measures  were administered, scored, and interpreted by this provider:  Generalized Anxiety Disorder-7 (GAD-7; 5 minutes), Patient Health Questionnaire-9 (PHQ-9; 5 minutes), Wechsler Adult Intelligence Scale-Fourth Edition (WAIS-5; 70 minutes), CNS Vital Signs (45 minutes), Adult Attention Deficit/Hyperactivity Disorder Self-Report Scale Checklist (ASRSv1.1; 15 minutes), Behavior Rating Inventory for Executive Function - 2A - Self Report (BRIEF 2A; 10 minutes) and Behavior Rating Inventory for Executive Function - 2A - Informant (BRIEF-2A; 10 minutes), Alcohol Use Disorders Identification Test (AUDIT) SF-10 (15 minutes), and Personality Assessment Inventory (PAI; 50 minutes). A total of 225 minutes was spent on the administration and scoring of the aforementioned measures. Codes 42595 and [id number] (6 units) were billed.  Please see the assessment for additional details. This provider completed the written report which includes integration of patient data, interpretation of standardized test results, interpretation of clinical data, review of information provided by [name] [name] and any collateral information/documentation, and clinical decision making (220 minutes in total).  Feedback Appointment: Date: 05/26/2023 Appointment Start Time: 9:04am Duration: 46 minutes Provider: Maryetta Sneddon, PsyD Type of Session: Feedback Appointment for Evaluation  Location of Patient: Home Location of Provider: Provider's Home (private office) Type of Contact: Caregility video visit with audio  Session Content: Today's appointment was a telepsychological visit due to COVID-19. [name] [name] is aware it is his responsibility to secure confidentiality on his end of the session. He provided verbal consent to proceed with today's appointment. Prior to proceeding with today's appointment, [name] [name]'s physical location at the time of this appointment was obtained as well a phone number he could  be reached at in the event of technical difficulties. [name] [name] denied anyone else being present in the room or on the virtual appointment. [name] [name] is aware of the limitations of teleheath visits and verbally consented to proceed.  This provider and [name] [name] completed the interactive feedback session which includes reviewing the aforementioned measures, treatment recommendations, and diagnostic conclusions.   The interactive feedback session was completed today and a total of 46 minutes was spent on feedback. Code 64332 was billed for feedback session.   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 05/11/2023: 8:50-9am (inputting chart review information into the evaluation), 11:10-12pm and 6:55-7:35pm. 05/18/2023: 4:15-5pm. 05/19/2023: 9:20-9:35am and 11:05-11:30am. 05/22/2023: 3:15-3:45pm. 05/26/2023: 10-10:05am. (billing code [id number] [4 units])  Plan: [name] [name] provided verbal consent for his evaluation to be sent via e-mail. No further follow-up planned by this provider.        CONFIDENTIAL PSYCHOLOGICAL EVALUATION ______________________________________________________________________________ Name: [name] [name]   Date of Birth: [birth year]    Age: 58 Dates of Evaluation: 05/11/2023, 05/12/2023, 05/17/2023, and 05/19/2023  SOURCE AND REASON FOR REFERRAL: [name] was self-referred for an evaluation to ascertain if he meets the criteria for Attention Deficit/Hyperactivity Disorder (ADHD).   EVALUATIVE PROCEDURES: Clinical Interview with [name] [name] (05/11/2023) Wechsler Adult Intelligence Scale-Fourth Edition (WAIS-5; 05/19/2023) CNS Vital Signs (05/12/2023) Adult Attention Deficit/Hyperactivity Disorder Self-Report Scale Checklist (05/12/2023) for  Executive Function - A - Self Report Behavior Rating Inventory for Executive Function - 2A - Self Report (BRIEF-2A; 05/17/2023) and Informant (05/17/2023) Personality Assessment Inventory (PAI; 05/17/2023) Patient Health Questionnaire-9 (PHQ-9) Generalized Anxiety Disorder-7 (GAD-7) Alcohol Use Disorders Identification Test (AUDIT) SF-10 (05/12/2023)   BACKGROUND INFORMATION AND PRESENTING PROBLEM: [name] [name] is a 58 year old male who resides in [place] .  [name] [name] reported he had a recent "annual physical" and that "one section pertained to [him]," noting he has a history of concentration issues, a tendency to "get bored," "constantly [be] up and down," and experiencing task initiation problems. He further reported he has experienced a significantly reduced lack of drive since his "16X." He endorsed the following ADHD-related symptoms:  Symptoms Frequency   Often Occasionally Infrequent/No Significant Issues  Inattention Criterion (DSM-5-TR)     Making careless mistakes and missing small details.    He denied experiencing significant issues with this symptom.   Being easily distracted by various stimuli, the mind often wanders elsewhere even when no apparent distractions exist.  He described himself as easily distracted but noted that it was "hard to pinpoint" what stimuli caused it, although he said that thoughts about other tasks are a common distractor.     Trouble sustaining attention during conversations.   He denied experiencing significant issues with this symptom. However, he noted that his wife comments that this symptom occurs with her, which he attributes to his perception that her communication is sometimes unclear.    Does not follow through on instructions and fails to finish tasks.  He described being prone to becoming "bored" with a task and switching to something else, which can contribute to an extended time being required to complete the initially planned task.      Difficulty organizing tasks and activities.  He endorsed haphazard task completion, a tendency to wait until near a deadline to start tasks, and the common presence of clutter around him.     Avoids, dislikes, or is reluctant to engage in tasks that require sustained mental effort. He discussed a tendency to put off task he "do[es not] want to do," and this is especially likely if the task requires sustained attention (e.g., completing continuing educations and studying for tests he is required to take for his employment).    Loses things necessary for tasks or activities.  He reported commonly forgetting needed items for tasks, stating that if an item is "out of sight," he is prone to forgetting it.    Forgetful in daily activities.   He reported being prone to forgetting to return a phone call with someone, and that inattention and task initiation issues contribute to this. During the interview, he occasionally forgot what he wanted to say.    Hyperactivity and Impulsivity Criterion (DSM-5-TR)     Fidgets with hands or feet or squirms in seat. He stated this has occurred "all [of his] life" and that others have commented on it (e.g., his mother called him "fidget fingers" as a child).     Leaves seat in situations in which remaining seated is expected.   He denied experiencing significant issues with this symptom.  Feelings of restlessness. He shared that he often feels restless and must be moving or doing something, especially when he must stay seated for extended periods.     Difficulty playing or engaging in leisure activities quietly.   He denied experiencing significant issues with this symptom.  "On the go" or often acts as if "driven by a motor."  He described himself as always feeling he has to be moving or doing something. In the past, others indicated he could be hard to keep up with, but since his 65s, he has experienced a significantly reduced drive to act on these thoughts and feelings.     Talks excessively.  He reported that he is prone to sharing "  multiple" stories while talking.    Interrupts others.  He stated he tends to "talk over" others despite efforts not to.     Impatience.    He denied experiencing significant issues with this symptom.  ADHD-Related Symptoms that Assist in Identifying ADHD but are not in the DSM-5-TR Arvil Birks, 2021, p. 6-12 and [phone])     Emotional dysregulation and overstimulation. He discussed that he tends to become overly excited and experience significant upset if a stressor occurs, adding that his "highs" seem to be "higher" and his low mood lower than others.     Making decisions impulsively. He described himself as "pretty spontaneous" and prone to making purchases he later regrets or that were a mistake and require him to return. He also started projects or tasks without fully understanding the directions.     Tending to drive much faster than others.   He denied experiencing significant issues with this symptom.  Trouble following through on promises or commitments made to others.   He reported that this occasionally occurs and is generally attributable to "something coming up" or reduced motivation to follow through on the plan once the time comes.    Trouble doing things in proper order or sequence.   He denied experiencing significant issues with this symptom.    He also discussed the history of occasional sleep maintenance issues, regularly tossing and turning in his sleep, and snoring loudly, adding that more recently, he has often not felt rested upon waking. He expressed a belief that his ADHD-related symptoms are consistent and independent of mood, noting they occur "every day" and that many have happened since childhood but that some (e.g., task initiation issues) have become exacerbated in recent years. He stated his coping and compensatory strategies include using designated spaces for items, remembering number of tiems he needs, using  "system" to remember to pay bills, use of a list, and making a "game in my head" that involves tracking how many times he did not act on an urge to interrupt.   Mr. Schmidtke denied awareness of having ever experienced any developmental milestone delays, grade retention, learning disability diagnosis, or having an individualized education plan. He reported he received "good" grades but "wasn't an Chief Executive Officer" through primary and secondary schooling. 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 He denied having a history of employment disciplinary actions. However, he noted how he is prone to waiting until the last minute to complete tasks he does not enjoy (e.g., continuing education and taking a required annual test).    Mr. Speros reported his medical history is significant for hypertension, hypercholesterolemia, and "irregular heartbeat" that are all being managed medically. He denied awareness of ever experiencing seizures or head injury. He reported use of two to three standard-sized alcoholic "cocktails" a day; a "pack and a half" of cigarettes a day, noting he has a desire to cease use of tobacco; and five to six standard-sized cups of coffee a day. He denied use of all other recreational or illicit substances. He also denied  ever experiencing psychiatric hospitalization or having utilized mental health services as well as ever meeting the full criteria for depressive episode; hypomanic or manic episode; social anxiety; autism spectrum disorder;  obsessions and compulsions; eating disorder; psychosis; suicidal or homicidal ideation, plan, or intent; or legal involvement. He stated his familial mental health history is significant for "ADHD" and "OCD" (son).   BEHAVIORAL OBSERVATIONS: Mr. Benites presented on time for the evaluation. He was well-groomed and oriented to the time, place, person, and purpose of the appointment. During the interview, Mr. Flener was occasionally tangential, would forget what he was saying, interrupt the evaluator prior to completion of the question, and appeared restless. During the evaluation, Mr. Dufford verbalized and/or demonstrated self-doubt (e.g., noting he felt "pretty stupid" throughout the evaluation), working memory-related difficulties (e.g., stating he had abruptly "lost" the digits he was trying to retain, providing answers quickly in what appeared to be an effort to say them before they were potentially forgotten, and regularly repeating the verbally provided information to himself multiple times as he attempted to manipulate it in the requested manner), word finding problems (e.g., saying, "What is the word I am looking for"), and impulsivity (e.g., making pop culture references that were irrelevant to his answer to the question). Throughout the evaluation, he maintained appropriate eye contact. His thought processes and content were logical, coherent, and goal-directed. There were no overt signs of a thought disorder or perceptual disturbances, nor did he report such symptomatology. There was no evidence of paraphasias (i.e., errors in speech, gross mispronunciations, and word substitutions), repetition deficits, or disturbances in volume or prosody (i.e., rhythm and intonation). Overall, based on Mr. Bisset approach to testing, the current results are believed to be a good estimate of his abilities.  PROCEDURAL CONSIDERATIONS: Psychological testing measures were conducted through a virtual visit with video  and audio capabilities, but otherwise in a standard manner.   The Wechsler Adult Intelligence Scale, Fifth Edition (WAIS-5) was administered via remote telepractice using digital stimulus materials on [employer] Q-global system. The remote testing environment appeared free of distractions, adequate rapport was established with the examinee via video/audio capabilities, and Mr. Wimp appeared appropriately engaged in the task throughout the session. While this evaluator was partway through stating a string of digits during a Running Digits task, Mr. Kidane reported an "audio glitch" occurred. As a result, after a pause, this evaluator stated he would start the task over. No other significant technological problems or distractions were noted during administration. Modifications to the standardization procedure included: none. The WAIS-5 subtests, or similar tasks, have received initial validation in several samples for remote telepractice and digital format administration, and the results are considered a valid description of Mr. Smalls skills and abilities.  CLINICAL FINDINGS:  COGNITIVE FUNCTIONING  Wechsler Adult Intelligence Scale, Fourth Edition (WAIS-5): Mr. Soriano completed subtests of the WAIS-5, a full-scale measure of cognitive ability. The WAIS-5 comprises four indices that measure cognitive processes that are components of intellectual ability; however, only subtests from the Verbal Comprehension and Working Memory indices were administered. As a result, Full-Scale-IQ (FSIQ) and General Ability Index (GAI) could not be determined.   WAIS-5 Scale/Subtest Composite Score/Scaled Score 95% Confidence Interval Percentile Rank Qualitative Description  Verbal Comprehension (VCI) 121 112-127 92 Very High  Similarities 14  91   Vocabulary 14  91   Working Memory (WMI) 128 119-133 97 Very High  Digit Sequencing  15  95   Running Digits 14  91   Digits Forward 12         75     The Verbal  Comprehension Index (VCI) measures one's ability to receive, comprehend, and express language. It also measures the ability to retrieve previously learned information and to understand relationships between words and concepts presented orally. Mr. Hutzell obtained a VCI score of 121 (92nd percentile), placing him in the very high range compared to same-aged peers. His performance on the subtests comprising this index was similar, which suggests his verbal reasoning abilities are comparably developed.  The Working Memory Index (WMI) measures one's ability to sustain attention, concentrate, and exert mental control. Mr. Hutchings obtained a WMI score of 128 (97th percentile), placing him in the very high range compared to same-aged peers. He demonstrated similar performance on the subtests comprising this index.  ATTENTION AND PROCESSING  CNS Vital Signs: The CNS Vital Signs assessment evaluates the neurocognitive status of an individual and covers a range of mental processes. The results of the CNS Vital Signs testing indicated average neurocognitive processing ability. Attentional abilities ranged from average to above, with simple attention in the average range and complex attention and sustained attention in the average range. Executive function and cognitive flexibility were above. Psychomotor speed, motor speed, reaction time, and processing speed were average, which indicates average hand-eye coordination, thinking speed, and responsiveness. Visual memory (images) and verbal memory (words) were average, which suggests they are comparably developed. The results suggest Mr. Missel experiences no deficits in the measured domains but has strengths in complex attention, cognitive flexibility, executive function, working memory, and sustained attention. Upon follow-up, Mr. Stolte shared that he enjoyed the Stroop Test portion and "treated it like a puzzle."  Domain  Standard Score Percentile Validity Indicator  Guideline  Neurocognitive Index 108 70 Yes Average  Composite Memory 95 37 Yes Average  Verbal Memory 99 47 Yes Average  Visual Memory 94 34 Yes Average  Psychomotor Speed 108 70 Yes Average  Reaction Time 102 55 Yes Average  Complex Attention 118 88 Yes Above  Cognitive Flexibility 115 84 Yes Above  Processing Speed  106 66 Yes Average  Executive Function 114 82 Yes Above  Working Memory 111 77 Yes Above  Sustained Attention 113 81 Yes Average  Simple Attention 107 68 Yes Average  Motor Speed 108 70 Yes Average   EXECUTIVE FUNCTION Behavior Rating Inventory of Executive Function, Second Edition Event organiser) Self-Report:  Mr. Cude completed the Self-Report Form of the Behavior Rating Inventory of Executive Function-Adult Version, Second Edition [employer]), which has three domains that evaluate cognitive, behavioral, and emotional regulation, and a Global Executive Composite score provides an overall snapshot of executive functioning. There are no missing item responses in the protocol.  The Negativity, Infrequency, and Inconsistency scales are not elevated, suggesting he did not respond to the protocol in an overly negative, haphazard, extreme, or inconsistent manner. In the context of these validity considerations, ratings of  Mr. Sikich everyday executive function suggest some areas of concern. The overall index score, the GEC, was highly elevated (GEC T = 75, %ile = >99). The Emotion Regulation Index (ERI) score was within normal limits (ERI T = 64, %ile = 89), but the Behavior Regulation Index (BRI) score was highly elevated (BRI T = 79, %ile = >99) and the Cognitive Regulation Index (CRI) score was moderately elevated (CRI T = 74, %ile = 99). Mr. Madrazo indicated difficulty with his ability to resist impulses, be aware of his functioning in social settings,  get going on tasks and activities and independently generate ideas, sustain working memory, plan and organize his approach to problem  solving appropriately, and be appropriately cautious in his approach to tasks and check for mistakes. He did not describe his ability to adjust well to changes, react to events appropriately, and keep materials and belongings reasonably well-organized as problematic. However, the Shift and Emotional Control scales approached an abnormal elevation.  Scale/Index  Raw Score T Score Percentile Qualitative Description  Inhibit 17 74 >99 Moderately Elevated  Self-Monitor 14 78 >99 Highly Elevated  Behavior Regulation Index (BRI) 31 79 >99 Highly Elevated  Shift 11 63 92 Approaching an Elevation  Emotional Control 14 62 90 Approaching an Elevation  Emotion Regulation Index (ERI) 25 64 89 Approaching an Elevation  Initiate 18 73 98 Moderately Elevated  Working Memory 21 85 >99 Highly Elevated  Plan/Organize 16 68 97 Mildly Elevated  Task Monitor 13 73 >99 Moderately Elevated  Organization of Materials 13 57 80 Within Normal Limits  Cognitive Regulation Index (CRI) 81 74 99 Moderately Elevated  Global Executive Composite (GEC) 137 75 >99 Highly Elevated   Validity Scale Raw Score Cumulative Percentile Protocol Classification  Inconsistency 3 98 Acceptable  Negativity 1 98 Acceptable  Infrequency 0 98 Acceptable   Behavior Rating Inventory of Executive Function, Second Edition Event organiser) Informant:  Mr. Revers spouse, Ms. Nyra Bellis, completed the Informant Form of the Behavior Rating Inventory of Executive Function-Adult Version, Second Edition [facility], [place]), which is equivalent to the Self-Report version and has three domains that evaluate cognitive, behavioral, and emotional regulation, and a Global Executive Composite score provides an overall snapshot of executive functioning. There are no missing item responses in the protocol.  The Negativity, Infrequency, and Inconsistency scales are not elevated, suggesting she did not respond to the protocol in an overly negative, haphazard, extreme, or  inconsistent manner. In the context of these validity considerations, Ms. Finlay's ratings of Mr. Kawczynski everyday executive function suggest some areas of concern. The overall index score, the GEC, was highly elevated (GEC T = 75, %ile = 98). The Emotion Regulation Index (ERI) score was within normal limits (ERI T = 62, %ile = 89), but the Behavior Regulation Index (BRI) score was highly elevated (BRI T = 78, %ile = >99) and the Cognitive Regulation Index (CRI) score was moderately elevated (CRI T = 73, %ile = 98). Ms. Carter indicated Mr. Samberg experiences difficulty with his ability to resist impulses, be aware of their functioning in social settings, adjust well to changes, get going on tasks and activities and independently generate ideas, sustain working memory, plan and organize their approach to problem solving appropriately, be appropriately cautious in their approach to tasks and check for mistakes, and keep materials and belongings reasonably well-organized. Ms. Cloutier did not describe his ability to react to events appropriately as problematic.    Scale/Index  Raw Score T Score Percentile Qualitative Description  Inhibit 20 82 >99 Highly Elevated  Self-Monitor 12 65 96 Mildly Elevated  Behavior Regulation Index (BRI) 32 78 >99 Highly Elevated  Shift 14 69 >99 Mildly Elevated  Emotional Control 11 51 71 Within Normal Limits  Emotion Regulation Index (ERI) 25 62 89 Approaching an Elevation  Initiate 15 67 98 Mildly Elevated  Working Memory 18 77 >99 Highly Elevated  Plan/Organize 15 66 97 Mildly Elevated  Task-Monitor 12 65 98 Mildly Elevated  Organization of Materials 17 66 94 Mildly Elevated  Cognitive Regulation Index (CRI) 77 73 98 Moderately Elevated  Global Executive Composite (GEC) 134 75 98 Highly Elevated   Validity Scale Raw Score Cumulative Percentile Protocol Classification  Inconsistency 2 99 Acceptable  Negativity 2 98 Acceptable  Infrequency 0 98 Acceptable    BEHAVIORAL FUNCTIONING   Patient Health Questionnaire-9 (PHQ-9): Mr. Petrossian completed the PHQ-9, a self-report measure that assesses symptoms of depression. He scored 8/27, which indicates mild depression.   Generalized Anxiety Disorder-7 (GAD-7): Mr. Borsellino completed the GAD-7, a self-report measure that assesses symptoms of anxiety. He scored 9/21, which indicates mild anxiety.   Adult ADHD Self-Report Scale Symptom Checklist (ASRS): Mr. Shimanek reported the following symptoms as sometimes: feeling overly active and compelled to do things, making careless mistakes when working on boring or complex projects, and misplacing or has difficulty finding things. He endorsed the following symptoms as occurring often: difficulty wrapping up the final details of a project following the completion of challenging aspects, difficulty getting things in order when a task requires organization, struggling to concentrate on what people say even when they are speaking directly to him, being distracted by the noise around him, and interrupting others when they are busy. He endorsed the following symptoms as very often: avoiding or delaying getting started on tasks requiring a lot of thought, fidgeting or squirming, struggling to sustain attention when doing boring or repetitive work, feeling restless or fidgety, difficulty relaxing, talking too much in social situations, and interrupting others or finishing their sentences. The endorsement of at least four items in Part A is highly consistent with ADHD in adults. The frequency scores of Part B provides additional cues. Mr. Kritikos scored 4/6 on Part A and 8/12 on Part B, which is considered a positive screening for ADHD.   Alcohol Use Disorders Identification Test (AUDIT) SF-10: The AUDIT is a self-report measure for harmful alcohol consumption. Mr. Leverette scored 9/40, which indicates a significant likelihood of hazardous or harmful alcohol consumption. He indicated having  a drink containing alcohol four or more times per week, having three to four drinks on days that he drinks, consuming six or more drinks monthly, and being unable to remember what happened the night before because of drinking on a less than monthly basis. He denied having trouble stopping drinking once he started, failing to do what was normally expected of him, needing a drink in the morning to get going after a heavy drinking session, feeling guilt or remorse after drinking, and that a loved one or healthcare provider had been concerned about his drinking.   Personality Assessment Inventory (PAI): The PAI is an objective inventory of adult personality. The validity indicators suggested Mr. Branan profile is interpretable (ICN T = 52, INF T = 51, NIM T = 44, and PIM T = 41). He is endorsing difficulty relaxing and fatigue (ANX-A T = 68) and overt physical signs of tension (e.g., trembling hands, complaints of irregular heartbeats, and/or shortness of breath; ANX-P T = 72); prominent unhappiness and dysphoria (DEP T = 74 and DEP-A T = 72) that includes thoughts of inadequacy (DEP-C T = 61), anhedonia (DEP-A t = 72), and vegetative signs of depression (e.g., disturbance in sleep pattern, changes in appetite, and/or lack of drive); and an activity level that is somewhat higher than normal (MAN-A T = 60) and problems with concentration and decision making (SCZ-T T = 64). He indicated he is warm and sympathetic (WRM T = 62) as well as appears to acknowledge the need to make some changes, has a positive attitude toward the possibility  of personal change, and accepts the importance of personal responsibility (RXR T = 48).    SUMMARY AND CLINICAL IMPRESSIONS: Mr. Neerav Hilborn is a 58 year old male who was self-referred for an evaluation to determine if he currently meets criteria for a diagnosis of Attention-Deficit/Hyperactivity Disorder (ADHD).   Mr. Kreikemeier stated he recently had an "annual physical" and that  "one section pertained to [him]," noting it involved various ADHD-related symptoms. He further reported he has experienced a significantly reduced lack of drive since his "89Y." He expressed a belief that his ADHD-related symptoms are consistent and independent of mood, noting they occur "every day" and that many have happened since childhood but that some (e.g., task initiation issues) have become exacerbated in recent years.   During the evaluation, Mr. Arnall was administered assessments to measure his current cognitive abilities. His verbal comprehension abilities were in the very high range and comparably developed. His ability to sustain attention, concentrate, and exert mental control was also in the very high range. Results of the CNS Vital Signs indicated an average neurocognitive processing ability with no deficits in the measured domains. He demonstrated strengths in complex attention, cognitive flexibility, executive function, working memory, and sustained attention.   During the clinical interview and on self-report measures, Mr. Coutts endorsed executive functioning concerns that include attentional dysregulation, hyperactivity- and impulsivity-related symptoms, and meeting the full criteria for ADHD. Moreover, his spouse, Ms. Nyra Bellis, also indicated he is experiencing multiple significant executive functioning issues. 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 While he did not endorse symptoms over what is needed to make the diagnosis, the specifier of "Moderate" was assigned as he indicated the endorsed symptoms cause marked impairment in his academic (e.g., performed significantly worse in classes he was not interested in, regularly "cram[ming] the night before an exam," and commonly getting in trouble for  "talking" or "cracking jokes" during class), occupational (e.g., prone to waiting until last minute to complete tasks), social (e.g., frequently engaging in excessive talking and interrupting of others), and daily (e.g., regularly experiencing being easily distracted, task initiation and completion issues, and disorganization) functioning.   Mr. Ryba also endorsed occasional sleep maintenance issues, regularly tossing and turning in his sleep, and snoring loudly, adding that more recently, he has often not felt rested upon waking. The PHQ-9, GAD-7, AUDIT, and PAI were administered. Results indicated he experiences mild depression- and anxiety-related symptoms as well as a significant likelihood of hazardous or harmful alcohol consumption. However, given the limited scope of this evaluation, it was not possible to determine if full criteria for depressive disorder, anxiety disorder, or sleep-wake disorder are met or if his diagnosis of ADHD better explains the symptoms. Similarly, this evaluator was unable to determine if he meets the full criteria for alcohol use disorder. He would likely benefit from further evaluation of these symptoms to definitively rule in or out the aforementioned disorders. Should any of the aforementioned be ruled in, they would likely be in addition to his diagnosis of ADHD, as he described his ADHD-related concerns as occurring before the concerns and substance use, as consistent across situations, and independent of mood.  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 Olascoaga would likely benefit from speaking with his medical care team about his endorsed sleep issues.  Mr. Reinisch would likely benefit from a consultation regarding medication for ADHD symptoms.  Individual therapeutic services may assist in processing a diagnosis of ADHD and discussing coping and compensatory  strategies. Mr. Koogler would likely benefit from making use of strategies for ADHD symptoms:  Setting a timer to complete tasks. Break tasks into manageable chunks and spread them out over more extended periods with breaks.  Utilizing lists and day calendars to keep track of tasks.  Answering emails daily.  Improve listening skills by asking the speaker to give information in smaller chunks and asking for explanations and clarification as needed. Leaving more than the anticipated time to complete tasks. It may help to keep tasks brief, well within your attention span, and a mix of both high and low-interest tasks. Tasks may be gradually increased in length. Practice proactive planning by setting aside time every evening to plan for the next day (e.g., prepare needed materials or pack the car the night before).  Learn how to make a practical and reasonable "to-do" list of important tasks and priorities. Always keep it easily accessible, and make additional copies in case it is lost or misplaced. Utilize visual reminders by posting appointments, "to-do lists," or schedules in strategic areas at home and work.  Practice using an appointment book, smartphone, or other tech device, or a daily planning calendar, and learn to write down appointments and commitments immediately. Keep notepads or use a portable audio recorder to capture important ideas that would be beneficial to recall later. Learn and practice time management skills. Purchase a programmable alarm watch or set an alarm on a smartphone to avoid losing track of time.  Use a color-coded file system, desk and closet organizers, storage boxes, or other organization devices to reduce clutter and improve efficiency and structure.  Implement ways to become more aware of your actions and to inhibit or adjust them as warranted (e.g., reviewing videos of your actions, considering consequences of obeying or not obeying the rules of various upcoming  situations, having a trusted other to discuss plans with, and/or provide cues to stop certain behaviors, and make visual cues for rules you would like to follow). The 4Rs: Read just one paragraph, recite out loud in a soft voice or whisper what was important in that material, write that material down in a notebook, then review what you just wrote. Stay flexible and be prepared to change your plans, as symptom breakthroughs and crises will likely occur periodically. Mr. Carelock may benefit from mindfulness training to address symptoms of inattention.  Mental alertness/energy can be raised by increasing exercise; improving sleep; eating a healthy diet; reducing alcohol use; and managing stress. Consulting with a physician regarding any changes to the physical regimen is recommended. "Failing at Normal: An ADHD Success Story" by Beulah Brunt is a great overview of ADHD. Dr. Magdalena Scholz also has a YouTube channel called, "Magdalena Scholz, PhD - Dedicated to ADHD Science+" with helpful videos on ADHD-related topics: [email or web address]  Applications:  RescueTime. Tracks your activities on your phone and/or computer to determine how productive you have been and what distracted you. Free two-week trial.  Focus@Will . It uses engineered audio that may reduce distractions and assist with focus. Free 15-day trial. Freedom. Allows you to highlight days and times you want to block yourself from certain sites or apps. Free trial. Librado Reef.  It allows you to input your bank accounts and creates a visual layout of information about your financial goals, budget management, alerts, etc. May offer a free trial. Boomerang. It allows you to schedule times an email is sent and  to see if others have received or opened your email. Ten messages free per month and a free trial of the premium version. IFTTT. Uses "channels" to create various actions (e.g., if you are mentioned in an email, highlight  it in your inbox, and if you miss a call, add it to a to-do list). Free and premium versions. Unroll.me. Cleans up your email by unsubscribing from what you do not want to receive while still getting everything you do. Free. Finish. It allows you to divide two-list tasks into short-term, mid-term, and long-term tasks and determine how much time is left for a task. Focus mode hides non-priority tasks.  Autosilent. Turns your phone ringer on and off based on specified calendars, geo-fences, timers, etc. $3.99. Freakyalarm. It makes you solve math problems to disable an alarm. $1.99. Wake N Shake. It makes you vigorously shake your phone to stop the alarm. $.99. Todoist. It allows you to add sub-tasks to tasks and includes email and web plugins to make it work across the system. Premium has location-based reminders, calendar sync, productive tracking, etc.  Sleep Cycle. Utilize your phone's motion sensors to catch movement while you are asleep. The alarm will wake you as early as 30 minutes before your alarm based on your lightest sleep phase and show you how daily activities affect your sleep quality.  Books: "Taking Charge of Adult ADHD Second Edition" by Dr. Magdalena Scholz "The ADHD Effect on Marriage" by Easter Golden "The Couples Guide to Thriving with ADHD" by Easter Golden "The ADHD Guide to Career Success" by Dr. Cleola Dach  Organizations that are a reliable source of information on ADHD:  Children and Adults with Attention-Deficit/Hyperactivity Disorder (CHADD): chadd.org  Attention Deficit Disorder Association (ADDA): HotterNames.de ADD Resources: addresources.org ADD WareHouse: addwarehouse.com World Federation of ADHD: adhd-federation.org ADDConsults: FightListings.se. Compilation of ADHD resources: https://www.harrell.com/ Future evaluation, if deemed necessary, and/or to determine the effectiveness of recommended interventions.   Maryetta Sneddon,  Psy.D. Licensed Psychologist - HSP-P [phone]   References  Arvil Birks, R. A. (2021). Taking charge of adult ADHD: proven strategies to succeed at work, at   home, and in relationships (pp. 6-10 and 272-276). [employer].              Veronica Gordon, PsyD

## 2023-05-27 ENCOUNTER — Other Ambulatory Visit: Payer: Self-pay | Admitting: Family Medicine

## 2023-06-13 ENCOUNTER — Ambulatory Visit: Payer: Self-pay | Admitting: Family Medicine

## 2023-06-13 ENCOUNTER — Other Ambulatory Visit

## 2023-06-13 DIAGNOSIS — R931 Abnormal findings on diagnostic imaging of heart and coronary circulation: Secondary | ICD-10-CM

## 2023-06-13 LAB — HEPATIC FUNCTION PANEL
ALT: 26 U/L (ref 0–53)
AST: 27 U/L (ref 0–37)
Albumin: 4.4 g/dL (ref 3.5–5.2)
Alkaline Phosphatase: 20 U/L — ABNORMAL LOW (ref 39–117)
Bilirubin, Direct: 0.1 mg/dL (ref 0.0–0.3)
Total Bilirubin: 0.5 mg/dL (ref 0.2–1.2)
Total Protein: 7.5 g/dL (ref 6.0–8.3)

## 2023-06-13 LAB — LIPID PANEL
Cholesterol: 147 mg/dL (ref 0–200)
HDL: 59.7 mg/dL (ref >39.00)
LDL Cholesterol: 71 mg/dL (ref 0–99)
NonHDL: 87.48
Total CHOL/HDL Ratio: 2
Triglycerides: 83 mg/dL (ref 0.0–149.0)
VLDL: 16.6 mg/dL (ref 0.0–40.0)

## 2023-07-06 DIAGNOSIS — M25561 Pain in right knee: Secondary | ICD-10-CM | POA: Diagnosis not present

## 2023-07-06 DIAGNOSIS — M1711 Unilateral primary osteoarthritis, right knee: Secondary | ICD-10-CM | POA: Diagnosis not present

## 2023-07-12 ENCOUNTER — Other Ambulatory Visit: Payer: Self-pay | Admitting: Family Medicine

## 2023-08-05 ENCOUNTER — Other Ambulatory Visit: Payer: Self-pay | Admitting: Family Medicine

## 2023-08-11 DIAGNOSIS — E291 Testicular hypofunction: Secondary | ICD-10-CM | POA: Diagnosis not present

## 2023-08-16 DIAGNOSIS — M660 Rupture of popliteal cyst: Secondary | ICD-10-CM | POA: Diagnosis not present

## 2023-08-16 DIAGNOSIS — M25471 Effusion, right ankle: Secondary | ICD-10-CM | POA: Diagnosis not present

## 2023-08-16 DIAGNOSIS — M25561 Pain in right knee: Secondary | ICD-10-CM | POA: Diagnosis not present

## 2023-08-16 DIAGNOSIS — M79604 Pain in right leg: Secondary | ICD-10-CM | POA: Diagnosis not present

## 2023-08-17 ENCOUNTER — Ambulatory Visit (HOSPITAL_COMMUNITY): Admission: RE | Admit: 2023-08-17 | Source: Ambulatory Visit

## 2023-08-17 ENCOUNTER — Other Ambulatory Visit (HOSPITAL_COMMUNITY): Payer: Self-pay | Admitting: Family Medicine

## 2023-08-17 DIAGNOSIS — R52 Pain, unspecified: Secondary | ICD-10-CM

## 2023-08-18 ENCOUNTER — Ambulatory Visit (HOSPITAL_COMMUNITY)
Admission: RE | Admit: 2023-08-18 | Discharge: 2023-08-18 | Disposition: A | Discharge status: Home / Self Care | Source: Ambulatory Visit | Attending: Vascular Surgery | Admitting: Vascular Surgery

## 2023-08-18 DIAGNOSIS — R52 Pain, unspecified: Secondary | ICD-10-CM | POA: Diagnosis not present

## 2023-08-18 DIAGNOSIS — N5201 Erectile dysfunction due to arterial insufficiency: Secondary | ICD-10-CM | POA: Diagnosis not present

## 2023-08-18 DIAGNOSIS — E291 Testicular hypofunction: Secondary | ICD-10-CM | POA: Diagnosis not present

## 2023-08-19 DIAGNOSIS — M2241 Chondromalacia patellae, right knee: Secondary | ICD-10-CM | POA: Diagnosis not present

## 2023-08-19 DIAGNOSIS — M660 Rupture of popliteal cyst: Secondary | ICD-10-CM | POA: Diagnosis not present

## 2023-08-19 DIAGNOSIS — S83241D Other tear of medial meniscus, current injury, right knee, subsequent encounter: Secondary | ICD-10-CM | POA: Diagnosis not present

## 2023-08-24 DIAGNOSIS — M660 Rupture of popliteal cyst: Secondary | ICD-10-CM | POA: Diagnosis not present

## 2023-08-24 DIAGNOSIS — M2241 Chondromalacia patellae, right knee: Secondary | ICD-10-CM | POA: Diagnosis not present

## 2023-08-24 DIAGNOSIS — S83241D Other tear of medial meniscus, current injury, right knee, subsequent encounter: Secondary | ICD-10-CM | POA: Diagnosis not present

## 2023-09-09 DIAGNOSIS — S83241A Other tear of medial meniscus, current injury, right knee, initial encounter: Secondary | ICD-10-CM | POA: Diagnosis not present

## 2023-09-09 DIAGNOSIS — M7121 Synovial cyst of popliteal space [Baker], right knee: Secondary | ICD-10-CM | POA: Diagnosis not present

## 2023-09-13 ENCOUNTER — Other Ambulatory Visit: Payer: Self-pay | Admitting: Family Medicine

## 2023-09-21 ENCOUNTER — Other Ambulatory Visit: Payer: Self-pay | Admitting: Family Medicine

## 2023-10-11 DIAGNOSIS — G8918 Other acute postprocedural pain: Secondary | ICD-10-CM | POA: Diagnosis not present

## 2023-10-11 DIAGNOSIS — M94261 Chondromalacia, right knee: Secondary | ICD-10-CM | POA: Diagnosis not present

## 2023-10-11 DIAGNOSIS — M23321 Other meniscus derangements, posterior horn of medial meniscus, right knee: Secondary | ICD-10-CM | POA: Diagnosis not present

## 2023-10-27 ENCOUNTER — Other Ambulatory Visit: Payer: Self-pay | Admitting: Family Medicine

## 2023-11-25 ENCOUNTER — Other Ambulatory Visit: Payer: Self-pay | Admitting: Family Medicine

## 2023-11-25 NOTE — Telephone Encounter (Signed)
 Copied from CRM [id number]. Topic: Clinical - Medication Refill >> Nov 25, 2023 10:40 AM Laymon HERO wrote: Medication: SYMBICORT  160-4.5 MCG/ACT inhaler  Has the patient contacted their pharmacy? Yes (Agent: If no, request that the patient contact the pharmacy for the refill. If patient does not wish to contact the pharmacy document the reason why and proceed with request.) (Agent: If yes, when and what did the pharmacy advise?)  This is the patient's preferred pharmacy:  [id number] - [place], [place] - [phone] [place] [address] Phone: [phone] Fax: [phone]  Is this the correct pharmacy for this prescription? Yes If no, delete pharmacy and type the correct one.   Has the prescription been filled recently? Yes  Is the patient out of the medication? Yes  Has the patient been seen for an appointment in the last year OR does the patient have an upcoming appointment? Yes  Can we respond through MyChart? Yes  Agent: Please be advised that Rx refills may take up to 3 business days. We ask that you follow-up with your pharmacy.

## 2023-12-20 ENCOUNTER — Telehealth: Payer: Self-pay | Admitting: *Deleted

## 2023-12-20 ENCOUNTER — Other Ambulatory Visit: Payer: Self-pay | Admitting: *Deleted

## 2023-12-20 DIAGNOSIS — F17210 Nicotine dependence, cigarettes, uncomplicated: Secondary | ICD-10-CM

## 2023-12-20 DIAGNOSIS — Z122 Encounter for screening for malignant neoplasm of respiratory organs: Secondary | ICD-10-CM

## 2023-12-20 DIAGNOSIS — Z87891 Personal history of nicotine dependence: Secondary | ICD-10-CM

## 2023-12-20 NOTE — Telephone Encounter (Signed)
 Lung Cancer Screening Narrative/Criteria Questionnaire (Cigarette Smokers Only- No Cigars/Pipes/vapes)   [name] [name]   SDMV:01/04/24 10:00- [name]                                           07/31/1965              LDCT: 01/10/24 9:00- [name]    58 y.o.   Phone: [phone]  Lung Screening Narrative (confirm age 43-77 yrs Medicare / 50-80 yrs Private pay insurance)   Insurance information:BCBS   Referring Provider:[place]te   This screening involves an initial phone call with a team member from our program. It is called a shared decision making visit. The initial meeting is required by insurance and Medicare to make sure you understand the program. This appointment takes about 15-20 minutes to complete. The CT scan will completed at a separate date/time. This scan takes about 5-10 minutes to complete and you may eat and drink before and after the scan.  Criteria questions for Lung Cancer Screening:   Are you a current or former smoker? Current Age began smoking: 61   If you are a former smoker, what year did you quit smoking? (within 15 yrs)   To calculate your smoking history, I need an accurate estimate of how many packs of cigarettes you smoked per day and for how many years. (Not just the number of PPD you are now smoking)   Years smoking 33 x Packs per day 1.5 = Pack years 50   (at least 20 pack yrs)   (Make sure they understand that we need to know how much they have smoked in the past, not just the number of PPD they are smoking now)  Do you have a personal history of cancer?  No    Do you have a family history of cancer? No  Are you coughing up blood?  No  Have you had unexplained weight loss of 15 lbs or more in the last 6 months? No  It looks like you meet all criteria.     Additional information: N/A

## 2024-01-04 ENCOUNTER — Ambulatory Visit (INDEPENDENT_AMBULATORY_CARE_PROVIDER_SITE_OTHER): Admitting: Adult Health

## 2024-01-04 ENCOUNTER — Encounter: Payer: Self-pay | Admitting: Adult Health

## 2024-01-04 DIAGNOSIS — F17210 Nicotine dependence, cigarettes, uncomplicated: Secondary | ICD-10-CM | POA: Diagnosis not present

## 2024-01-04 NOTE — Progress Notes (Signed)
°  Virtual Visit via Telephone Note  I connected with [name] [name] , 01/04/24 10:14 AM by a telemedicine application and verified that I am speaking with the correct person using two identifiers.  Location: Patient: home Provider: home   I discussed the limitations of evaluation and management by telemedicine and the availability of in person appointments. The patient expressed understanding and agreed to proceed.   Shared Decision Making Visit Lung Cancer Screening Program [id number])   Eligibility: 58 y.o. Pack Years Smoking History Calculation = 50 pack years  (# packs/per year x # years smoked) Recent History of coughing up blood  no Unexplained weight loss? no ( >Than 15 pounds within the last 6 months ) Prior History Lung / other cancer no (Diagnosis within the last 5 years already requiring surveillance chest CT Scans). Smoking Status Current Smoker   Visit Components: Discussion included one or more decision making aids. YES Discussion included risk/benefits of screening. YES Discussion included potential follow up diagnostic testing for abnormal scans. YES Discussion included meaning and risk of over diagnosis. YES Discussion included meaning and risk of False Positives. YES Discussion included meaning of total radiation exposure. YES  Counseling Included: Importance of adherence to annual lung cancer LDCT screening. YES Impact of comorbidities on ability to participate in the program. YES Ability and willingness to under diagnostic treatment. YES  Smoking Cessation Counseling: Current Smokers:  Discussed importance of smoking cessation. yes Information about tobacco cessation classes and interventions provided to patient. yes Patient provided with ticket for LDCT Scan. yes Symptomatic Patient. NO Diagnosis Code: Tobacco Use Z72.0 Asymptomatic Patient yes  Counseling - 4 minutes of smoking cessation counseling  (CT Chest Lung Cancer Screening Low Dose W/O CM)  [id number]  Smoking/Tobacco Cessation Counseling [name] [name] is a current user of tobacco or nicotine products. He is considering quitting at this time. Counseling provided today addressed the risks of continued use and the benefits of cessation. Discussed tobacco/nicotine use history, readiness to quit, and evidence-based treatment options including behavioral strategies, support resources, and pharmacologic therapies. Provided encouragement and educational materials on steps and resources to quit smoking. Patient questions were addressed, and follow-up recommended for continued support. Total time spent on counseling: 3 minutes.    Z12.2-Screening of respiratory organs Z87.891-Personal history of nicotine dependence   [name] [name] 01/04/24

## 2024-01-04 NOTE — Patient Instructions (Signed)

## 2024-01-09 ENCOUNTER — Other Ambulatory Visit: Payer: Self-pay | Admitting: Family Medicine

## 2024-01-10 ENCOUNTER — Ambulatory Visit (HOSPITAL_BASED_OUTPATIENT_CLINIC_OR_DEPARTMENT_OTHER)
Admission: RE | Admit: 2024-01-10 | Discharge: 2024-01-10 | Disposition: A | Source: Ambulatory Visit | Attending: Acute Care | Admitting: Acute Care

## 2024-01-10 DIAGNOSIS — Z122 Encounter for screening for malignant neoplasm of respiratory organs: Secondary | ICD-10-CM | POA: Insufficient documentation

## 2024-01-10 DIAGNOSIS — F17210 Nicotine dependence, cigarettes, uncomplicated: Secondary | ICD-10-CM

## 2024-01-10 DIAGNOSIS — Z87891 Personal history of nicotine dependence: Secondary | ICD-10-CM

## 2024-01-16 ENCOUNTER — Other Ambulatory Visit: Payer: Self-pay | Admitting: Acute Care

## 2024-01-16 DIAGNOSIS — Z122 Encounter for screening for malignant neoplasm of respiratory organs: Secondary | ICD-10-CM

## 2024-01-16 DIAGNOSIS — Z87891 Personal history of nicotine dependence: Secondary | ICD-10-CM

## 2024-01-24 ENCOUNTER — Telehealth: Admitting: Emergency Medicine

## 2024-01-24 DIAGNOSIS — J019 Acute sinusitis, unspecified: Secondary | ICD-10-CM | POA: Diagnosis not present

## 2024-01-24 DIAGNOSIS — B9789 Other viral agents as the cause of diseases classified elsewhere: Secondary | ICD-10-CM

## 2024-01-24 NOTE — Progress Notes (Signed)
 " Virtual Visit Consent   [name], you are scheduled for a virtual visit with a  provider today. Just as with appointments in the office, your consent must be obtained to participate. Your consent will be active for this visit and any virtual visit you may have with one of our providers in the next 365 days. If you have a MyChart account, a copy of this consent can be sent to you electronically.  As this is a virtual visit, video technology does not allow for your provider to perform a traditional examination. This may limit your provider's ability to fully assess your condition. If your provider identifies any concerns that need to be evaluated in person or the need to arrange testing (such as labs, EKG, etc.), we will make arrangements to do so. Although advances in technology are sophisticated, we cannot ensure that it will always work on either your end or our end. If the connection with a video visit is poor, the visit may have to be switched to a telephone visit. With either a video or telephone visit, we are not always able to ensure that we have a secure connection.  By engaging in this virtual visit, you consent to the provision of healthcare and authorize for your insurance to be billed (if applicable) for the services provided during this visit. Depending on your insurance coverage, you may receive a charge related to this service.  I need to obtain your verbal consent now. Are you willing to proceed with your visit today? MANINDER DEBOER has provided verbal consent on 01/24/2024 for a virtual visit (video or telephone). [name], NP  Date: 01/24/2024 1:04 PM   Virtual Visit via Video Note   I, [name], connected with  [name] [name]  ([id number], 1965-04-15) on 01/24/2024 at 12:45 PM EST by a video-enabled telemedicine application and verified that I am speaking with the correct person using two identifiers.  Location: Patient: Virtual Visit Location Patient:  Home Provider: Virtual Visit Location Provider: Home Office   I discussed the limitations of evaluation and management by telemedicine and the availability of in person appointments. The patient expressed understanding and agreed to proceed.    History of Present Illness: [name] [name] is a 58 y.o. who identifies as a male who was assigned male at birth, and is being seen today for sick since 01/20/24. Started with sore throat, then other sx came.   Very congested, cough, post nasal drainage. Lots of sinus pressure. Clear mucus. No SOB. Does have wheezing normally, not worse than normal it is his normal. Is still smoking.   Doesn't think it is the flu, had it earlier this year and it doesn't feel like the flu.   Taking advil  and alka seltzer. Has Flonase but isn't really using it.   HPI: HPI  Problems:  Patient Active Problem List   Diagnosis Date Noted   Essential hypertension 01/27/2021   Moderate persistent asthma 03/28/2019   Colon polyp 04/20/2017   H/O wheezing 03/15/2017   Nonsustained paroxysmal supraventricular tachycardia 07/13/2013   Chest pain 11/22/2012   Tobacco abuse 11/22/2012   GERD 12/23/2009    Allergies: Allergies[1] Medications: Current Medications[2]  Observations/Objective: Patient is well-developed, well-nourished in no acute distress.  Resting comfortably  at home.  Head is normocephalic, atraumatic.  No labored breathing.  Speech is clear and coherent with logical content.  Patient is alert and oriented at baseline.  Sounds congested on video  Assessment  and Plan: 1. Acute viral sinusitis (Primary)  No indication for antibiotics at this time. Likely viral uri causing sinus pressure. Discussed supportive care measures.   Follow Up Instructions: I discussed the assessment and treatment plan with the patient. The patient was provided an opportunity to ask questions and all were answered. The patient agreed with the plan and demonstrated an  understanding of the instructions.  A copy of instructions were sent to the patient via MyChart unless otherwise noted below.   The patient was advised to call back or seek an in-person evaluation if the symptoms worsen or if the condition fails to improve as anticipated.    [name], NP    [1] No Known Allergies [2]  Current Outpatient Medications:    albuterol  (PROVENTIL  HFA) 108 (90 Base) MCG/ACT inhaler, Inhale 2 puffs into the lungs every 4 (four) hours as needed for shortness of breath., Disp: 18 g, Rfl: 2   amLODipine  (NORVASC ) 5 MG tablet, TAKE 1 TABLET(5 MG) BY MOUTH DAILY, Disp: 30 tablet, Rfl: 5   atorvastatin  (LIPITOR) 20 MG tablet, TAKE 1 TABLET(20 MG) BY MOUTH DAILY, Disp: 90 tablet, Rfl: 1   benzonatate  (TESSALON ) 100 MG capsule, Take 1 capsule (100 mg total) by mouth 3 (three) times daily as needed for cough., Disp: 30 capsule, Rfl: 0   budesonide -formoterol  (SYMBICORT ) 160-4.5 MCG/ACT inhaler, INHALE 2 PUFFS INTO THE LUNGS TWICE DAILY, Disp: 10.2 g, Rfl: 0   carvedilol  (COREG ) 6.25 MG tablet, TAKE 1 TABLET BY MOUTH TWICE DAILY, Disp: 90 tablet, Rfl: 1   ibuprofen  (ADVIL ,MOTRIN ) 200 MG tablet, Take 400-600 mg by mouth daily as needed for headache or moderate pain., Disp: , Rfl:    olmesartan  (BENICAR ) 20 MG tablet, TAKE 1 TABLET(20 MG) BY MOUTH DAILY, Disp: 90 tablet, Rfl: 1   omeprazole  (PRILOSEC) 40 MG capsule, TAKE 1 CAPSULE BY MOUTH EVERY DAY, Disp: 90 capsule, Rfl: 3   oxymetazoline (AFRIN) 0.05 % nasal spray, Place 1 spray into both nostrils daily as needed for congestion., Disp: , Rfl:    Testosterone  30 MG/ACT SOLN, Place 1 application onto the skin daily., Disp: , Rfl:    VIAGRA 100 MG tablet, Take 100 mg by mouth as needed for erectile dysfunction. , Disp: , Rfl:   "

## 2024-01-24 NOTE — Patient Instructions (Signed)
 " [name] [name], thank you for joining [name], NP for today's virtual visit.  While this provider is not your primary care provider (PCP), if your PCP is located in our provider database this encounter information will be shared with them immediately following your visit.   A [facility] MyChart account gives you access to today's visit and all your visits, tests, and labs performed at [facility]  click here if you don't have a [facility] MyChart account or go to [email or web address]  Consent: (Patient) [name] [name] provided verbal consent for this virtual visit at the beginning of the encounter.  Current Medications:  Current Outpatient Medications:    albuterol  (PROVENTIL  HFA) 108 (90 Base) MCG/ACT inhaler, Inhale 2 puffs into the lungs every 4 (four) hours as needed for shortness of breath., Disp: 18 g, Rfl: 2   amLODipine  (NORVASC ) 5 MG tablet, TAKE 1 TABLET(5 MG) BY MOUTH DAILY, Disp: 30 tablet, Rfl: 5   atorvastatin  (LIPITOR) 20 MG tablet, TAKE 1 TABLET(20 MG) BY MOUTH DAILY, Disp: 90 tablet, Rfl: 1   benzonatate  (TESSALON ) 100 MG capsule, Take 1 capsule (100 mg total) by mouth 3 (three) times daily as needed for cough., Disp: 30 capsule, Rfl: 0   budesonide -formoterol  (SYMBICORT ) 160-4.5 MCG/ACT inhaler, INHALE 2 PUFFS INTO THE LUNGS TWICE DAILY, Disp: 10.2 g, Rfl: 0   carvedilol  (COREG ) 6.25 MG tablet, TAKE 1 TABLET BY MOUTH TWICE DAILY, Disp: 90 tablet, Rfl: 1   ibuprofen  (ADVIL ,MOTRIN ) 200 MG tablet, Take 400-600 mg by mouth daily as needed for headache or moderate pain., Disp: , Rfl:    olmesartan  (BENICAR ) 20 MG tablet, TAKE 1 TABLET(20 MG) BY MOUTH DAILY, Disp: 90 tablet, Rfl: 1   omeprazole  (PRILOSEC) 40 MG capsule, TAKE 1 CAPSULE BY MOUTH EVERY DAY, Disp: 90 capsule, Rfl: 3   oxymetazoline (AFRIN) 0.05 % nasal spray, Place 1 spray into both nostrils daily as needed for congestion., Disp: , Rfl:    Testosterone  30 MG/ACT SOLN, Place 1 application onto  the skin daily., Disp: , Rfl:    VIAGRA 100 MG tablet, Take 100 mg by mouth as needed for erectile dysfunction. , Disp: , Rfl:    Medications ordered in this encounter:  No orders of the defined types were placed in this encounter.    *If you need refills on other medications prior to your next appointment, please contact your pharmacy*  Follow-Up: Call back or seek an in-person evaluation if the symptoms worsen or if the condition fails to improve as anticipated.  [facility] [phone]  Other Instructions  Try Nasonex instead of Flonase.   Try using saline irrigation, such as with a neti pot, several times a day while you are sick. Many neti pots come with salt packets premeasured to use to make saline. If you use your own salt, make sure it is kosher salt or sea salt (don't use table salt as it has iodine in it and you don't need that in your nose). Use distilled water to make saline. If you mix your own saline using your own salt, the recipe is 1/4 teaspoon salt in 1 cup warm water. Using saline irrigation can help prevent and treat sinus infections.  I recommend adding Mucinex (ok to use generic guaifenesin) as well.   Antibiotics are not recommended by the Infectious Disease Society of America unless you have severe symptoms (including high fever) or you have symptoms for more than 10 days. If you still have  symptoms after 10 days, antibiotics should be considered.      If you have been instructed to have an in-person evaluation today at a local Urgent Care facility, please use the link below. It will take you to a list of all of our available [facility], including address, phone number and hours of operation. Please do not delay care.  [facility]  If you or a family member do not have a primary care provider, use the link below to schedule a visit and establish care. When you choose a [facility] primary care physician or advanced  practice provider, you gain a long-term partner in health. Find a Primary Care Provider  Learn more about [facility]'s in-office and virtual care options: [facility] - Get Care Now  "

## 2024-02-07 ENCOUNTER — Other Ambulatory Visit: Payer: Self-pay | Admitting: Family Medicine

## 2024-02-16 ENCOUNTER — Telehealth: Payer: Self-pay

## 2024-02-16 NOTE — Telephone Encounter (Signed)
 Patent is needing PA for Symbicort  160-4.5 mcg

## 2024-02-21 ENCOUNTER — Other Ambulatory Visit (HOSPITAL_COMMUNITY): Payer: Self-pay

## 2024-02-21 ENCOUNTER — Telehealth: Payer: Self-pay

## 2024-02-21 NOTE — Telephone Encounter (Signed)
 Noted.

## 2024-02-21 NOTE — Telephone Encounter (Signed)
 Pharmacy Patient Advocate Encounter   Received notification from Physician's Office that prior authorization for Symbicort  160-4.5 mcg/act inhaler is required/requested.   Insurance verification completed.   The patient is insured through [facility].   Per test claim: Refill too soon. PA is not needed at this time. Medication was filled 02/11/2024. Next eligible fill date is 03/05/2024.
